# Patient Record
Sex: Male | Born: 1970
Health system: Southern US, Community
[De-identification: ages and names within clinical notes are randomized; demographics above are authoritative.]

## PROBLEM LIST (undated history)

## (undated) DIAGNOSIS — E785 Hyperlipidemia, unspecified: Secondary | ICD-10-CM

## (undated) DIAGNOSIS — I219 Acute myocardial infarction, unspecified: Secondary | ICD-10-CM

## (undated) DIAGNOSIS — K219 Gastro-esophageal reflux disease without esophagitis: Secondary | ICD-10-CM

## (undated) DIAGNOSIS — I2510 Atherosclerotic heart disease of native coronary artery without angina pectoris: Secondary | ICD-10-CM

## (undated) DIAGNOSIS — I10 Essential (primary) hypertension: Secondary | ICD-10-CM

## (undated) HISTORY — PX: CARDIAC SURGERY: SHX584

## (undated) HISTORY — DX: Gastro-esophageal reflux disease without esophagitis: K21.9

## (undated) HISTORY — DX: Essential (primary) hypertension: I10

## (undated) HISTORY — DX: Atherosclerotic heart disease of native coronary artery without angina pectoris: I25.10

## (undated) HISTORY — DX: Hyperlipidemia, unspecified: E78.5

---

## 1992-10-13 HISTORY — PX: HERNIA REPAIR: SHX51

## 2018-03-11 DIAGNOSIS — K219 Gastro-esophageal reflux disease without esophagitis: Secondary | ICD-10-CM | POA: Insufficient documentation

## 2018-03-11 DIAGNOSIS — R1013 Epigastric pain: Secondary | ICD-10-CM | POA: Insufficient documentation

## 2018-03-11 DIAGNOSIS — R0789 Other chest pain: Secondary | ICD-10-CM | POA: Insufficient documentation

## 2018-03-11 DIAGNOSIS — R1310 Dysphagia, unspecified: Secondary | ICD-10-CM | POA: Insufficient documentation

## 2018-10-21 DIAGNOSIS — M5030 Other cervical disc degeneration, unspecified cervical region: Secondary | ICD-10-CM | POA: Insufficient documentation

## 2018-10-21 DIAGNOSIS — M7552 Bursitis of left shoulder: Secondary | ICD-10-CM | POA: Insufficient documentation

## 2018-10-21 DIAGNOSIS — M19012 Primary osteoarthritis, left shoulder: Secondary | ICD-10-CM | POA: Insufficient documentation

## 2019-01-09 DIAGNOSIS — Z23 Encounter for immunization: Secondary | ICD-10-CM | POA: Diagnosis not present

## 2019-03-15 DIAGNOSIS — M7652 Patellar tendinitis, left knee: Secondary | ICD-10-CM | POA: Diagnosis not present

## 2019-03-15 DIAGNOSIS — M25562 Pain in left knee: Secondary | ICD-10-CM | POA: Diagnosis not present

## 2019-04-04 DIAGNOSIS — K219 Gastro-esophageal reflux disease without esophagitis: Secondary | ICD-10-CM | POA: Diagnosis not present

## 2019-04-04 DIAGNOSIS — K222 Esophageal obstruction: Secondary | ICD-10-CM | POA: Diagnosis not present

## 2019-04-04 DIAGNOSIS — R1310 Dysphagia, unspecified: Secondary | ICD-10-CM | POA: Diagnosis not present

## 2019-06-02 DIAGNOSIS — K228 Other specified diseases of esophagus: Secondary | ICD-10-CM | POA: Diagnosis not present

## 2019-06-02 DIAGNOSIS — K449 Diaphragmatic hernia without obstruction or gangrene: Secondary | ICD-10-CM | POA: Diagnosis not present

## 2019-06-02 DIAGNOSIS — K222 Esophageal obstruction: Secondary | ICD-10-CM | POA: Diagnosis not present

## 2019-06-27 DIAGNOSIS — H00013 Hordeolum externum right eye, unspecified eyelid: Secondary | ICD-10-CM | POA: Diagnosis not present

## 2019-07-10 DIAGNOSIS — J069 Acute upper respiratory infection, unspecified: Secondary | ICD-10-CM | POA: Diagnosis not present

## 2019-07-10 DIAGNOSIS — J029 Acute pharyngitis, unspecified: Secondary | ICD-10-CM | POA: Diagnosis not present

## 2019-07-10 DIAGNOSIS — R05 Cough: Secondary | ICD-10-CM | POA: Diagnosis not present

## 2019-07-19 DIAGNOSIS — K222 Esophageal obstruction: Secondary | ICD-10-CM | POA: Diagnosis not present

## 2019-07-19 DIAGNOSIS — K219 Gastro-esophageal reflux disease without esophagitis: Secondary | ICD-10-CM | POA: Diagnosis not present

## 2019-08-31 DIAGNOSIS — L820 Inflamed seborrheic keratosis: Secondary | ICD-10-CM | POA: Diagnosis not present

## 2019-08-31 DIAGNOSIS — D224 Melanocytic nevi of scalp and neck: Secondary | ICD-10-CM | POA: Diagnosis not present

## 2020-04-11 DIAGNOSIS — L2089 Other atopic dermatitis: Secondary | ICD-10-CM | POA: Diagnosis not present

## 2020-04-11 DIAGNOSIS — L255 Unspecified contact dermatitis due to plants, except food: Secondary | ICD-10-CM | POA: Diagnosis not present

## 2020-08-07 DIAGNOSIS — B029 Zoster without complications: Secondary | ICD-10-CM | POA: Diagnosis not present

## 2020-08-08 DIAGNOSIS — K222 Esophageal obstruction: Secondary | ICD-10-CM | POA: Diagnosis not present

## 2020-08-08 DIAGNOSIS — Z1211 Encounter for screening for malignant neoplasm of colon: Secondary | ICD-10-CM | POA: Diagnosis not present

## 2020-08-08 DIAGNOSIS — R1310 Dysphagia, unspecified: Secondary | ICD-10-CM | POA: Diagnosis not present

## 2020-08-08 DIAGNOSIS — K219 Gastro-esophageal reflux disease without esophagitis: Secondary | ICD-10-CM | POA: Diagnosis not present

## 2020-09-13 DIAGNOSIS — K219 Gastro-esophageal reflux disease without esophagitis: Secondary | ICD-10-CM | POA: Diagnosis not present

## 2020-09-13 DIAGNOSIS — R1319 Other dysphagia: Secondary | ICD-10-CM | POA: Diagnosis not present

## 2020-09-13 DIAGNOSIS — R1310 Dysphagia, unspecified: Secondary | ICD-10-CM | POA: Diagnosis not present

## 2020-09-13 DIAGNOSIS — K209 Esophagitis, unspecified without bleeding: Secondary | ICD-10-CM | POA: Diagnosis not present

## 2020-09-13 DIAGNOSIS — K2289 Other specified disease of esophagus: Secondary | ICD-10-CM | POA: Diagnosis not present

## 2020-09-13 DIAGNOSIS — K2970 Gastritis, unspecified, without bleeding: Secondary | ICD-10-CM | POA: Diagnosis not present

## 2020-09-13 DIAGNOSIS — K449 Diaphragmatic hernia without obstruction or gangrene: Secondary | ICD-10-CM | POA: Diagnosis not present

## 2020-09-13 DIAGNOSIS — K222 Esophageal obstruction: Secondary | ICD-10-CM | POA: Diagnosis not present

## 2020-10-22 ENCOUNTER — Encounter: Payer: Self-pay | Admitting: Physician Assistant

## 2020-10-22 ENCOUNTER — Ambulatory Visit: Payer: BC Managed Care – PPO | Admitting: Physician Assistant

## 2020-10-22 ENCOUNTER — Other Ambulatory Visit: Payer: Self-pay

## 2020-10-22 VITALS — BP 120/78 | HR 75 | Temp 97.3°F | Ht 66.0 in | Wt 177.0 lb

## 2020-10-22 DIAGNOSIS — M5441 Lumbago with sciatica, right side: Secondary | ICD-10-CM | POA: Diagnosis not present

## 2020-10-22 MED ORDER — PREDNISONE 20 MG PO TABS: ORAL_TABLET | ORAL | 0 refills | Status: AC

## 2020-10-22 MED ORDER — CYCLOBENZAPRINE HCL 5 MG PO TABS: 5.0000 mg | ORAL_TABLET | Freq: Every day | ORAL | 0 refills | Status: DC

## 2020-10-22 NOTE — Patient Instructions (Signed)

## 2020-10-22 NOTE — Progress Notes (Signed)
Acute Office Visit  Subjective:    Patient ID: Jordan Lewis, male    DOB: 26-Dec-1970, 50 y.o.   MRN: [id number]  Chief Complaint  Patient presents with  . Back Pain    HPI Patient is in today for low back pain Pt states that over the past few weeks he has had some low back pain with numbness and tingling down his right leg - states it started after lifting Christmas decorations Denies weakness in right leg - has been using some advil  Past Medical History:  Diagnosis Date  . GERD (gastroesophageal reflux disease)     Past Surgical History:  Procedure Laterality Date  . HERNIA REPAIR  1994    Family History  Problem Relation Age of Onset  . Heart disease Father     Social History   Socioeconomic History  . Marital status: Married    Spouse name: Not on file  . Number of children: Not on file  . Years of education: Not on file  . Highest education level: Not on file  Occupational History  . Not on file  Tobacco Use  . Smoking status: Never Smoker  . Smokeless tobacco: Never Used  Vaping Use  . Vaping Use: Never used  Substance and Sexual Activity  . Alcohol use: Never  . Drug use: Never  . Sexual activity: Not on file  Other Topics Concern  . Not on file  Social History Narrative  . Not on file   Social Determinants of Health   Financial Resource Strain: Not on file  Food Insecurity: Not on file  Transportation Needs: Not on file  Physical Activity: Not on file  Stress: Not on file  Social Connections: Not on file  Intimate Partner Violence: Not on file    Outpatient Medications Prior to Visit  Medication Sig Dispense Refill  . famotidine (PEPCID) 40 MG tablet Please take one tablet 30 minutes before am/pm meal     No facility-administered medications prior to visit.    No Known Allergies  Review of Systems CONSTITUTIONAL: Negative for chills, fatigue, fever, unintentional weight gain and unintentional weight loss.  CARDIOVASCULAR: Negative  for chest pain, dizziness, palpitations and pedal edema.  RESPIRATORY: Negative for recent cough and dyspnea.  MSK: see HPI PSYCHIATRIC: Negative for sleep disturbance and to question depression screen.  Negative for depression, negative for anhedonia.         Objective:    Physical Exam PHYSICAL EXAM:   VS: BP 120/78 (BP Location: Left Arm, Patient Position: Sitting, Cuff Size: Normal)   Pulse 75   Temp (!) 97.3 F (36.3 C) (Temporal)   Ht 5' 6" (1.676 m)   Wt 177 lb (80.3 kg)   SpO2 99%   BMI 28.57 kg/m   GEN: Well nourished, well developed, in no acute distress  Cardiac: RRR; no murmurs, rubs, or gallops,no edema -  Respiratory:  normal respiratory rate and pattern with no distress - normal breath sounds with no rales, rhonchi, wheezes or rubs MS: no deformity or atrophy - rom normal - strength normal Psych: euthymic mood, appropriate affect and demeanor  BP 120/78 (BP Location: Left Arm, Patient Position: Sitting, Cuff Size: Normal)   Pulse 75   Temp (!) 97.3 F (36.3 C) (Temporal)   Ht 5' 6" (1.676 m)   Wt 177 lb (80.3 kg)   SpO2 99%   BMI 28.57 kg/m  Wt Readings from Last 3 Encounters:  10/22/20 177 lb (80.3 kg)  Health Maintenance Due  Topic Date Due  . Hepatitis C Screening  Never done  . HIV Screening  Never done  . COLONOSCOPY (Pts 45-74yr Insurance coverage will need to be confirmed)  Never done  . COVID-19 Vaccine (2 - Booster for Janssen series) 06/28/2020    There are no preventive care reminders to display for this patient.   No results found for: TSH No results found for: WBC, HGB, HCT, MCV, PLT No results found for: NA, K, CHLORIDE, CO2, GLUCOSE, BUN, CREATININE, BILITOT, ALKPHOS, AST, ALT, PROT, ALBUMIN, CALCIUM, ANIONGAP, EGFR, GFR No results found for: CHOL No results found for: HDL No results found for: LDLCALC No results found for: TRIG No results found for: CHOLHDL No results found for: HGBA1C     Assessment & Plan:  1. Acute  right-sided low back pain with right-sided sciatica - predniSONE (DELTASONE) 20 MG tablet; Take 3 tablets (60 mg total) by mouth daily with breakfast for 3 days, THEN 2 tablets (40 mg total) daily with breakfast for 3 days, THEN 1 tablet (20 mg total) daily with breakfast for 3 days.  Dispense: 18 tablet; Refill: 0 - cyclobenzaprine (FLEXERIL) 5 MG tablet; Take 1 tablet (5 mg total) by mouth at bedtime.  Dispense: 30 tablet; Refill: 0    Meds ordered this encounter  Medications  . predniSONE (DELTASONE) 20 MG tablet    Sig: Take 3 tablets (60 mg total) by mouth daily with breakfast for 3 days, THEN 2 tablets (40 mg total) daily with breakfast for 3 days, THEN 1 tablet (20 mg total) daily with breakfast for 3 days.    Dispense:  18 tablet    Refill:  0    Order Specific Question:   Supervising Provider    Answer:Rochel Brome[[id number] . cyclobenzaprine (FLEXERIL) 5 MG tablet    Sig: Take 1 tablet (5 mg total) by mouth at bedtime.    Dispense:  30 tablet    Refill:  0    Order Specific Question:   Supervising Provider    Answer:   CShelton Silvas   No orders of the defined types were placed in this encounter.     Follow-up: Return if symptoms worsen or fail to improve.  An After Visit Summary was printed and given to the patient.  [facility] ([phone]

## 2020-11-05 DIAGNOSIS — I213 ST elevation (STEMI) myocardial infarction of unspecified site: Secondary | ICD-10-CM | POA: Diagnosis not present

## 2020-11-05 DIAGNOSIS — D696 Thrombocytopenia, unspecified: Secondary | ICD-10-CM | POA: Diagnosis not present

## 2020-11-05 DIAGNOSIS — I2119 ST elevation (STEMI) myocardial infarction involving other coronary artery of inferior wall: Secondary | ICD-10-CM | POA: Diagnosis not present

## 2020-11-05 DIAGNOSIS — I499 Cardiac arrhythmia, unspecified: Secondary | ICD-10-CM | POA: Diagnosis not present

## 2020-11-05 DIAGNOSIS — Z955 Presence of coronary angioplasty implant and graft: Secondary | ICD-10-CM | POA: Insufficient documentation

## 2020-11-05 DIAGNOSIS — I2111 ST elevation (STEMI) myocardial infarction involving right coronary artery: Secondary | ICD-10-CM | POA: Diagnosis not present

## 2020-11-05 DIAGNOSIS — R0789 Other chest pain: Secondary | ICD-10-CM | POA: Diagnosis not present

## 2020-11-05 DIAGNOSIS — I2510 Atherosclerotic heart disease of native coronary artery without angina pectoris: Secondary | ICD-10-CM | POA: Diagnosis not present

## 2020-11-05 DIAGNOSIS — I25118 Atherosclerotic heart disease of native coronary artery with other forms of angina pectoris: Secondary | ICD-10-CM | POA: Diagnosis not present

## 2020-11-05 DIAGNOSIS — R9431 Abnormal electrocardiogram [ECG] [EKG]: Secondary | ICD-10-CM | POA: Diagnosis not present

## 2020-11-05 DIAGNOSIS — E785 Hyperlipidemia, unspecified: Secondary | ICD-10-CM | POA: Diagnosis not present

## 2020-11-05 DIAGNOSIS — E7849 Other hyperlipidemia: Secondary | ICD-10-CM | POA: Diagnosis not present

## 2020-11-05 DIAGNOSIS — E7800 Pure hypercholesterolemia, unspecified: Secondary | ICD-10-CM | POA: Diagnosis not present

## 2020-11-05 DIAGNOSIS — R079 Chest pain, unspecified: Secondary | ICD-10-CM | POA: Diagnosis not present

## 2020-11-07 DIAGNOSIS — E7800 Pure hypercholesterolemia, unspecified: Secondary | ICD-10-CM | POA: Insufficient documentation

## 2020-11-07 DIAGNOSIS — I25118 Atherosclerotic heart disease of native coronary artery with other forms of angina pectoris: Secondary | ICD-10-CM | POA: Insufficient documentation

## 2020-11-07 DIAGNOSIS — D696 Thrombocytopenia, unspecified: Secondary | ICD-10-CM | POA: Insufficient documentation

## 2020-11-08 ENCOUNTER — Telehealth: Payer: Self-pay | Admitting: *Deleted

## 2020-11-08 NOTE — Telephone Encounter (Signed)
Transition Care Management Follow-up Telephone Call  Date of discharge and from where: 11/07/2020  How have you been since you were released from the hospital? "I am doing well."  Any questions or concerns? No  Items Reviewed:  Did the pt receive and understand the discharge instructions provided? Yes   Medications obtained and verified? Yes   Other? Yes   Any new allergies since your discharge? No   Dietary orders reviewed? Yes  Do you have support at home? Yes   Home Care and Equipment/Supplies: Were home health services ordered? not applicable If so, what is the name of the agency? N/A  Has the agency set up a time to come to the patient's home? not applicable Were any new equipment or medical supplies ordered?  No What is the name of the medical supply agency? N/A Were you able to get the supplies/equipment? not applicable Do you have any questions related to the use of the equipment or supplies? No  Functional Questionnaire: (I = Independent and D = Dependent) ADLs: I  Bathing/Dressing- I  Meal Prep- I  Eating- I  Maintaining continence- I  Transferring/Ambulation- I  Managing Meds- I  Follow up appointments reviewed:   PCP Hospital f/u appt confirmed? No  - Patient will call to make appointment if necessary.   Specialist Hospital f/u appt confirmed? Yes  Scheduled to see Dr. Kingsley Callander (Cardio in [place]) on 11/26/2020.  Are transportation arrangements needed? No   If their condition worsens, is the pt aware to call PCP or go to the Emergency Dept.? Yes  Was the patient provided with contact information for the PCP's office or ED? Yes  Was to pt encouraged to call back with questions or concerns? Yes

## 2020-11-26 DIAGNOSIS — Z955 Presence of coronary angioplasty implant and graft: Secondary | ICD-10-CM | POA: Diagnosis not present

## 2020-11-26 DIAGNOSIS — I25118 Atherosclerotic heart disease of native coronary artery with other forms of angina pectoris: Secondary | ICD-10-CM | POA: Diagnosis not present

## 2020-11-26 DIAGNOSIS — I2111 ST elevation (STEMI) myocardial infarction involving right coronary artery: Secondary | ICD-10-CM | POA: Diagnosis not present

## 2020-11-26 DIAGNOSIS — E7800 Pure hypercholesterolemia, unspecified: Secondary | ICD-10-CM | POA: Diagnosis not present

## 2020-11-27 DIAGNOSIS — R9431 Abnormal electrocardiogram [ECG] [EKG]: Secondary | ICD-10-CM | POA: Diagnosis not present

## 2020-12-17 DIAGNOSIS — I6523 Occlusion and stenosis of bilateral carotid arteries: Secondary | ICD-10-CM | POA: Diagnosis not present

## 2021-01-22 DIAGNOSIS — M545 Low back pain, unspecified: Secondary | ICD-10-CM | POA: Diagnosis not present

## 2021-01-22 DIAGNOSIS — M5116 Intervertebral disc disorders with radiculopathy, lumbar region: Secondary | ICD-10-CM | POA: Diagnosis not present

## 2021-01-22 DIAGNOSIS — M5134 Other intervertebral disc degeneration, thoracic region: Secondary | ICD-10-CM | POA: Diagnosis not present

## 2021-03-25 DIAGNOSIS — R5383 Other fatigue: Secondary | ICD-10-CM | POA: Diagnosis not present

## 2021-03-25 DIAGNOSIS — E7800 Pure hypercholesterolemia, unspecified: Secondary | ICD-10-CM | POA: Diagnosis not present

## 2021-03-25 DIAGNOSIS — Z955 Presence of coronary angioplasty implant and graft: Secondary | ICD-10-CM | POA: Diagnosis not present

## 2021-03-25 DIAGNOSIS — I2111 ST elevation (STEMI) myocardial infarction involving right coronary artery: Secondary | ICD-10-CM | POA: Diagnosis not present

## 2021-03-25 DIAGNOSIS — I25118 Atherosclerotic heart disease of native coronary artery with other forms of angina pectoris: Secondary | ICD-10-CM | POA: Diagnosis not present

## 2021-04-12 DIAGNOSIS — R1084 Generalized abdominal pain: Secondary | ICD-10-CM | POA: Diagnosis not present

## 2021-04-12 DIAGNOSIS — R109 Unspecified abdominal pain: Secondary | ICD-10-CM | POA: Diagnosis not present

## 2021-04-22 ENCOUNTER — Emergency Department (HOSPITAL_BASED_OUTPATIENT_CLINIC_OR_DEPARTMENT_OTHER): Payer: BC Managed Care – PPO

## 2021-04-22 ENCOUNTER — Emergency Department (HOSPITAL_BASED_OUTPATIENT_CLINIC_OR_DEPARTMENT_OTHER)
Admission: EM | Admit: 2021-04-22 | Discharge: 2021-04-22 | Disposition: A | Discharge status: Home / Self Care | Payer: BC Managed Care – PPO | Attending: Emergency Medicine | Admitting: Emergency Medicine

## 2021-04-22 ENCOUNTER — Other Ambulatory Visit: Payer: Self-pay

## 2021-04-22 ENCOUNTER — Encounter (HOSPITAL_BASED_OUTPATIENT_CLINIC_OR_DEPARTMENT_OTHER): Payer: Self-pay | Admitting: Emergency Medicine

## 2021-04-22 DIAGNOSIS — R109 Unspecified abdominal pain: Secondary | ICD-10-CM | POA: Diagnosis not present

## 2021-04-22 DIAGNOSIS — K219 Gastro-esophageal reflux disease without esophagitis: Secondary | ICD-10-CM | POA: Diagnosis not present

## 2021-04-22 DIAGNOSIS — R1084 Generalized abdominal pain: Secondary | ICD-10-CM | POA: Diagnosis not present

## 2021-04-22 DIAGNOSIS — K8020 Calculus of gallbladder without cholecystitis without obstruction: Secondary | ICD-10-CM

## 2021-04-22 DIAGNOSIS — R1032 Left lower quadrant pain: Secondary | ICD-10-CM

## 2021-04-22 HISTORY — DX: Acute myocardial infarction, unspecified: I21.9

## 2021-04-22 LAB — URINALYSIS, ROUTINE W REFLEX MICROSCOPIC
Bilirubin Urine: NEGATIVE
Glucose, UA: NEGATIVE mg/dL
Hgb urine dipstick: NEGATIVE
Ketones, ur: NEGATIVE mg/dL
Leukocytes,Ua: NEGATIVE
Nitrite: NEGATIVE
Protein, ur: NEGATIVE mg/dL
Specific Gravity, Urine: <1.005 — ABNORMAL LOW (ref 1.005–1.030)
pH: 6.5 (ref 5.0–8.0)

## 2021-04-22 LAB — CBC WITH DIFFERENTIAL/PLATELET
Abs Immature Granulocytes: 0.02 10*3/uL (ref 0.00–0.07)
Basophils Absolute: 0 10*3/uL (ref 0.0–0.1)
Basophils Relative: 1 %
Eosinophils Absolute: 0 10*3/uL (ref 0.0–0.5)
Eosinophils Relative: 1 %
HCT: 48.6 % (ref 39.0–52.0)
Hemoglobin: 16.7 g/dL (ref 13.0–17.0)
Immature Granulocytes: 0 %
Lymphocytes Relative: 28 %
Lymphs Abs: 1.8 10*3/uL (ref 0.7–4.0)
MCH: 30.4 pg (ref 26.0–34.0)
MCHC: 34.4 g/dL (ref 30.0–36.0)
MCV: 88.4 fL (ref 80.0–100.0)
Monocytes Absolute: 0.6 10*3/uL (ref 0.1–1.0)
Monocytes Relative: 9 %
Neutro Abs: 3.9 10*3/uL (ref 1.7–7.7)
Neutrophils Relative %: 61 %
Platelets: 164 10*3/uL (ref 150–400)
RBC: 5.5 MIL/uL (ref 4.22–5.81)
RDW: 12.1 % (ref 11.5–15.5)
WBC: 6.4 10*3/uL (ref 4.0–10.5)
nRBC: 0 % (ref 0.0–0.2)

## 2021-04-22 LAB — COMPREHENSIVE METABOLIC PANEL
ALT: 71 U/L — ABNORMAL HIGH (ref 0–44)
AST: 52 U/L — ABNORMAL HIGH (ref 15–41)
Albumin: 4.5 g/dL (ref 3.5–5.0)
Alkaline Phosphatase: 69 U/L (ref 38–126)
Anion gap: 6 (ref 5–15)
BUN: 16 mg/dL (ref 6–20)
CO2: 29 mmol/L (ref 22–32)
Calcium: 9.2 mg/dL (ref 8.9–10.3)
Chloride: 103 mmol/L (ref 98–111)
Creatinine, Ser: 1.07 mg/dL (ref 0.61–1.24)
GFR, Estimated: >60 mL/min (ref >60)
Glucose, Bld: 110 mg/dL — ABNORMAL HIGH (ref 70–99)
Potassium: 4.2 mmol/L (ref 3.5–5.1)
Sodium: 138 mmol/L (ref 135–145)
Total Bilirubin: 0.3 mg/dL (ref 0.3–1.2)
Total Protein: 7.1 g/dL (ref 6.5–8.1)

## 2021-04-22 MED ORDER — IOHEXOL 300 MG/ML  SOLN
100.0000 mL | Freq: Once | INTRAMUSCULAR | Status: AC | PRN
  Administered 2021-04-22: 100 mL via INTRAVENOUS

## 2021-04-22 NOTE — ED Provider Notes (Signed)
[facility] EMERGENCY DEPARTMENT Provider Note   CSN: [id number] Arrival date & time: 04/22/21  1706     History Chief Complaint  Patient presents with   Flank Pain    Jordan Lewis is a 50 y.o. male.  The history is provided by the patient. No language interpreter was used.  Abdominal Pain Pain location:  Generalized Pain radiates to:  Does not radiate Pain severity:  Moderate Onset quality:  Gradual Timing:  Constant Progression:  Worsening Relieved by:  Nothing Worsened by:  Nothing Ineffective treatments:  None tried Associated symptoms: no nausea   Risk factors: no alcohol abuse and has not had multiple surgeries   Pt complains of left lower abdominal pain. Pt is finishing antibiotics for diverticulitis.  Pt reports he still has left lower abdominal discomfort.  Pt's MD advised him to come to the ED for ct scan.     Past Medical History:  Diagnosis Date   GERD (gastroesophageal reflux disease)    MI (myocardial infarction) (HCC)     Patient Active Problem List   Diagnosis Date Noted   Esophageal stricture 04/04/2019   Patellar tendinitis of left knee 03/15/2019   Arthritis of left acromioclavicular joint 10/21/2018   Degenerative cervical disc 10/21/2018   Subacromial bursitis of left shoulder joint 10/21/2018   Atypical chest pain 03/11/2018   Dysphagia 03/11/2018   Epigastric pain 03/11/2018   Gastroesophageal reflux disease 03/11/2018    Past Surgical History:  Procedure Laterality Date   CARDIAC SURGERY     HERNIA REPAIR  1994       Family History  Problem Relation Age of Onset   Heart disease Father     Social History   Tobacco Use   Smoking status: Never   Smokeless tobacco: Never  Vaping Use   Vaping Use: Never used  Substance Use Topics   Alcohol use: Never   Drug use: Never    Home Medications Prior to Admission medications   Medication Sig Start Date End Date Taking? Authorizing Provider  cyclobenzaprine (FLEXERIL)  5 MG tablet Take 1 tablet (5 mg total) by mouth at bedtime. 10/22/20   Marianne Easter [place], PA-C  famotidine (PEPCID) 40 MG tablet Please take one tablet 30 minutes before am/pm meal 10/03/20   [provider]    Allergies    Patient has no known allergies.  Review of Systems   Review of Systems  Gastrointestinal:  Positive for abdominal pain. Negative for nausea.  All other systems reviewed and are negative.  Physical Exam Updated Vital Signs BP (!) 143/89 (BP Location: Left Arm)   Pulse 68   Temp 98 F (36.7 C) (Oral)   Resp 20   Ht 5\' 6"  (1.676 m)   Wt 76.2 kg   SpO2 99%   BMI 27.12 kg/m   Physical Exam Vitals and nursing note reviewed.  Constitutional:      Appearance: He is well-developed.  HENT:     Head: Normocephalic.     Right Ear: Tympanic membrane normal.     Left Ear: Tympanic membrane normal.     Nose: Nose normal.     Mouth/Throat:     Mouth: Mucous membranes are moist.  Cardiovascular:     Rate and Rhythm: Normal rate.  Pulmonary:     Effort: Pulmonary effort is normal.  Abdominal:     General: There is no distension.  Musculoskeletal:        General: Normal range of motion.  Cervical back: Normal range of motion.  Skin:    General: Skin is warm.  Neurological:     Mental Status: He is alert and oriented to person, place, and time.  Psychiatric:        Mood and Affect: Mood normal.    ED Results / Procedures / Treatments   Labs (all labs ordered are listed, but only abnormal results are displayed) Labs Reviewed - No data to display  EKG None  Radiology CT ABDOMEN PELVIS W CONTRAST  Result Date: 04/22/2021 CLINICAL DATA:  Abdominal pain EXAM: CT ABDOMEN AND PELVIS WITH CONTRAST TECHNIQUE: Multidetector CT imaging of the abdomen and pelvis was performed using the standard protocol following bolus administration of intravenous contrast. CONTRAST:  OMNIPAQUE IOHEXOL 300 MG/ML  SOLN COMPARISON:  None. FINDINGS: LOWER CHEST: Normal.  HEPATOBILIARY: Normal hepatic contours. No intra- or extrahepatic biliary dilatation. There is cholelithiasis without acute inflammation. PANCREAS: Normal pancreas. No ductal dilatation or peripancreatic fluid collection. SPLEEN: Normal. ADRENALS/URINARY TRACT: The adrenal glands are normal. No hydronephrosis, nephroureterolithiasis or solid renal mass. The urinary bladder is normal for degree of distention STOMACH/BOWEL: There is no hiatal hernia. Normal duodenal course and caliber. No small bowel dilatation or inflammation. Rectosigmoid diverticulosis without acute inflammation. Normal appendix. VASCULAR/LYMPHATIC: Normal course and caliber of the major abdominal vessels. No abdominal or pelvic lymphadenopathy. REPRODUCTIVE: Normal prostate size with symmetric seminal vesicles. MUSCULOSKELETAL. No bony spinal canal stenosis or focal osseous abnormality. OTHER: None. IMPRESSION: 1. No acute abnormality of the abdomen or pelvis. 2. Cholelithiasis without acute inflammation. Electronically Signed   By: Deatra Robinson M.D.   On: 04/22/2021 20:31    Procedures Procedures   Medications Ordered in ED Medications - No data to display  ED Course  I have reviewed the triage vital signs and the nursing notes.  Pertinent labs & imaging results that were available during my care of the patient were reviewed by me and considered in my medical decision making (see chart for details).    MDM Rules/Calculators/A&P                          MDM: Pt counseled on results, no diverticulitis.  Pt advised of gallstone and follow up   Final Clinical Impression(s) / ED Diagnoses Final diagnoses:  Abdominal pain, left lower quadrant  Calculus of gallbladder without cholecystitis without obstruction    Rx / DC Orders ED Discharge Orders     None     An After Visit Summary was printed and given to the patient.    Osie Cheeks 04/23/21 1538    Palumbo, April, MD 05/02/21 [phone]

## 2021-04-22 NOTE — Discharge Instructions (Addendum)
Follow up with Gi for recheck.  Return if any problems.

## 2021-04-22 NOTE — ED Triage Notes (Signed)
Pt arrives pov with c/o L flank pain x 4days. Pt endorses diverticulitis, treated with abx, pain improved in lower abdomen. Pt denies dysuria, or constipation, deneis fever. Pt denies N/V

## 2021-04-22 NOTE — ED Notes (Signed)
Patient reports mild llq pain radiating to left flank, patient has been on antibiotics for diverticulitis and just completed his full course, "expected the pain to be gone by now, it's better, but not completely gone.

## 2021-05-03 ENCOUNTER — Encounter: Payer: Self-pay | Admitting: Physician Assistant

## 2021-05-03 ENCOUNTER — Ambulatory Visit: Payer: BC Managed Care – PPO | Admitting: Physician Assistant

## 2021-05-03 ENCOUNTER — Other Ambulatory Visit: Payer: Self-pay

## 2021-05-03 VITALS — BP 118/82 | HR 67 | Temp 97.3°F | Ht 66.0 in | Wt 172.8 lb

## 2021-05-03 DIAGNOSIS — I25118 Atherosclerotic heart disease of native coronary artery with other forms of angina pectoris: Secondary | ICD-10-CM | POA: Diagnosis not present

## 2021-05-03 NOTE — Progress Notes (Signed)
Subjective:  Patient ID: Jordan Lewis, male    DOB: 06-15-1971  Age: 50 y.o. MRN: [id number]  Chief Complaint  Patient presents with   Follow-up    HPI  Pt with history of CAD - he had MI in January - follows with Dr Katrinka Blazing - currently on metoprolol, lipitor and Prasugrel - voices no chest pain/sob/edema  Pt recently had diverticulitis and was treated with antibiotics - he had CT of abdomen after his treatment on 7/11 because he was still having some discomfort - and that test was normal Current Outpatient Medications on File Prior to Visit  Medication Sig Dispense Refill   aspirin 81 MG EC tablet Take 1 tablet by mouth daily.     atorvastatin (LIPITOR) 80 MG tablet Take by mouth.     lisinopril (ZESTRIL) 5 MG tablet Take 1 tablet by mouth daily.     metoprolol tartrate (LOPRESSOR) 25 MG tablet Take by mouth.     cyclobenzaprine (FLEXERIL) 5 MG tablet Take 1 tablet (5 mg total) by mouth at bedtime. 30 tablet 0   famotidine (PEPCID) 40 MG tablet Please take one tablet 30 minutes before am/pm meal     prasugrel (EFFIENT) 10 MG TABS tablet Take 10 mg by mouth daily.     No current facility-administered medications on file prior to visit.   Past Medical History:  Diagnosis Date   GERD (gastroesophageal reflux disease)    MI (myocardial infarction) (HCC)    Past Surgical History:  Procedure Laterality Date   CARDIAC SURGERY     HERNIA REPAIR  1994    Family History  Problem Relation Age of Onset   Heart disease Father    Social History   Socioeconomic History   Marital status: Married    Spouse name: Not on file   Number of children: Not on file   Years of education: Not on file   Highest education level: Not on file  Occupational History   Not on file  Tobacco Use   Smoking status: Never   Smokeless tobacco: Never  Vaping Use   Vaping Use: Never used  Substance and Sexual Activity   Alcohol use: Never   Drug use: Never   Sexual activity: Not on file  Other Topics  Concern   Not on file  Social History Narrative   Not on file   Social Determinants of Health   Financial Resource Strain: Not on file  Food Insecurity: Not on file  Transportation Needs: Not on file  Physical Activity: Not on file  Stress: Not on file  Social Connections: Not on file    Review of Systems  CONSTITUTIONAL: Negative for chills, fatigue, fever, unintentional weight gain and unintentional weight loss.  E/N/T: Negative for ear pain, nasal congestion and sore throat.  CARDIOVASCULAR: Negative for chest pain, dizziness, palpitations and pedal edema.  RESPIRATORY: Negative for recent cough and dyspnea.  GASTROINTESTINAL: Negative for abdominal pain, acid reflux symptoms, constipation, diarrhea, nausea and vomiting.  MSK: Negative for arthralgias and myalgias.  INTEGUMENTARY: Negative for rash.  NEUROLOGICAL: Negative for dizziness and headaches.  PSYCHIATRIC: Negative for sleep disturbance and to question depression screen.  Negative for depression, negative for anhedonia.      Objective:  BP 118/82 (BP Location: Left Arm, Patient Position: Sitting, Cuff Size: Normal)   Pulse 67   Temp (!) 97.3 F (36.3 C) (Temporal)   Ht 5\' 6"  (1.676 m)   Wt 172 lb 12.8 oz (78.4 kg)   SpO2  95%   BMI 27.89 kg/m   BP/Weight 05/03/2021 04/22/2021 10/22/2020  Systolic BP 118 129 120  Diastolic BP 82 92 78  Wt. (Lbs) 172.8 168 177  BMI 27.89 27.12 28.57    Physical Exam PHYSICAL EXAM:   VS: BP 118/82 (BP Location: Left Arm, Patient Position: Sitting, Cuff Size: Normal)   Pulse 67   Temp (!) 97.3 F (36.3 C) (Temporal)   Ht 5\' 6"  (1.676 m)   Wt 172 lb 12.8 oz (78.4 kg)   SpO2 95%   BMI 27.89 kg/m   GEN: Well nourished, well developed, in no acute distress  Cardiac: RRR; no murmurs, rubs, or gallops,no edema - Respiratory:  normal respiratory rate and pattern with no distress - normal breath sounds with no rales, rhonchi, wheezes or rubs GI: normal bowel sounds, no masses  or tenderness Psych: euthymic mood, appropriate affect and demeanor  Diabetic Foot Exam - Simple   No data filed      Lab Results  Component Value Date   WBC 6.4 04/22/2021   HGB 16.7 04/22/2021   HCT 48.6 04/22/2021   PLT 164 04/22/2021   GLUCOSE 110 (H) 04/22/2021   ALT 71 (H) 04/22/2021   AST 52 (H) 04/22/2021   NA 138 04/22/2021   K 4.2 04/22/2021   CL 103 04/22/2021   CREATININE 1.07 04/22/2021   BUN 16 04/22/2021   CO2 29 04/22/2021      Assessment & Plan:   1. Coronary artery disease of native artery of native heart with stable angina pectoris (HCC) - CBC with Differential/Platelet - Comprehensive metabolic panel - Lipid panel    No orders of the defined types were placed in this encounter.   Orders Placed This Encounter  Procedures   CBC with Differential/Platelet   Comprehensive metabolic panel   Lipid panel     Follow-up: Return in about 6 months (around 11/03/2021) for chronic fasting follow up.  An After Visit Summary was printed and given to the patient.  11/05/2021 [facility] [phone]

## 2021-05-04 LAB — COMPREHENSIVE METABOLIC PANEL
ALT: 54 IU/L — ABNORMAL HIGH (ref 0–44)
AST: 29 IU/L (ref 0–40)
Albumin/Globulin Ratio: 2.9 — ABNORMAL HIGH (ref 1.2–2.2)
Albumin: 4.7 g/dL (ref 4.0–5.0)
Alkaline Phosphatase: 87 IU/L (ref 44–121)
BUN/Creatinine Ratio: 14 (ref 9–20)
BUN: 16 mg/dL (ref 6–24)
Bilirubin Total: 0.6 mg/dL (ref 0.0–1.2)
CO2: 22 mmol/L (ref 20–29)
Calcium: 9.7 mg/dL (ref 8.7–10.2)
Chloride: 102 mmol/L (ref 96–106)
Creatinine, Ser: 1.14 mg/dL (ref 0.76–1.27)
Globulin, Total: 1.6 g/dL (ref 1.5–4.5)
Glucose: 107 mg/dL — ABNORMAL HIGH (ref 65–99)
Potassium: 5.8 mmol/L — ABNORMAL HIGH (ref 3.5–5.2)
Sodium: 144 mmol/L (ref 134–144)
Total Protein: 6.3 g/dL (ref 6.0–8.5)
eGFR: 78 mL/min/{1.73_m2} (ref >59)

## 2021-05-04 LAB — CBC WITH DIFFERENTIAL/PLATELET
Basophils Absolute: 0 10*3/uL (ref 0.0–0.2)
Basos: 1 %
EOS (ABSOLUTE): 0.1 10*3/uL (ref 0.0–0.4)
Eos: 1 %
Hematocrit: 50.3 % (ref 37.5–51.0)
Hemoglobin: 16.8 g/dL (ref 13.0–17.7)
Immature Grans (Abs): 0 10*3/uL (ref 0.0–0.1)
Immature Granulocytes: 0 %
Lymphocytes Absolute: 1.7 10*3/uL (ref 0.7–3.1)
Lymphs: 29 %
MCH: 29.8 pg (ref 26.6–33.0)
MCHC: 33.4 g/dL (ref 31.5–35.7)
MCV: 89 fL (ref 79–97)
Monocytes Absolute: 0.6 10*3/uL (ref 0.1–0.9)
Monocytes: 10 %
Neutrophils Absolute: 3.5 10*3/uL (ref 1.4–7.0)
Neutrophils: 59 %
Platelets: 134 10*3/uL — ABNORMAL LOW (ref 150–450)
RBC: 5.64 x10E6/uL (ref 4.14–5.80)
RDW: 12.6 % (ref 11.6–15.4)
WBC: 5.9 10*3/uL (ref 3.4–10.8)

## 2021-05-04 LAB — LIPID PANEL
Chol/HDL Ratio: 3.1 ratio (ref 0.0–5.0)
Cholesterol, Total: 110 mg/dL (ref 100–199)
HDL: 35 mg/dL — ABNORMAL LOW (ref >39)
LDL Chol Calc (NIH): 55 mg/dL (ref 0–99)
Triglycerides: 109 mg/dL (ref 0–149)
VLDL Cholesterol Cal: 20 mg/dL (ref 5–40)

## 2021-05-04 LAB — CARDIOVASCULAR RISK ASSESSMENT

## 2021-05-06 ENCOUNTER — Other Ambulatory Visit: Payer: Self-pay | Admitting: Physician Assistant

## 2021-05-06 DIAGNOSIS — R899 Unspecified abnormal finding in specimens from other organs, systems and tissues: Secondary | ICD-10-CM

## 2021-05-22 ENCOUNTER — Other Ambulatory Visit: Payer: Self-pay | Admitting: Physician Assistant

## 2021-05-22 ENCOUNTER — Other Ambulatory Visit: Payer: BC Managed Care – PPO

## 2021-05-22 ENCOUNTER — Other Ambulatory Visit: Payer: Self-pay

## 2021-05-22 DIAGNOSIS — R899 Unspecified abnormal finding in specimens from other organs, systems and tissues: Secondary | ICD-10-CM

## 2021-05-23 ENCOUNTER — Other Ambulatory Visit: Payer: Self-pay | Admitting: Physician Assistant

## 2021-05-23 DIAGNOSIS — R899 Unspecified abnormal finding in specimens from other organs, systems and tissues: Secondary | ICD-10-CM

## 2021-05-23 LAB — COMPREHENSIVE METABOLIC PANEL
ALT: 55 IU/L — ABNORMAL HIGH (ref 0–44)
AST: 35 IU/L (ref 0–40)
Albumin/Globulin Ratio: 2.6 — ABNORMAL HIGH (ref 1.2–2.2)
Albumin: 4.7 g/dL (ref 4.0–5.0)
Alkaline Phosphatase: 100 IU/L (ref 44–121)
BUN/Creatinine Ratio: 10 (ref 9–20)
BUN: 12 mg/dL (ref 6–24)
Bilirubin Total: 0.7 mg/dL (ref 0.0–1.2)
CO2: 24 mmol/L (ref 20–29)
Calcium: 9.4 mg/dL (ref 8.7–10.2)
Chloride: 104 mmol/L (ref 96–106)
Creatinine, Ser: 1.16 mg/dL (ref 0.76–1.27)
Globulin, Total: 1.8 g/dL (ref 1.5–4.5)
Glucose: 96 mg/dL (ref 65–99)
Potassium: 5.1 mmol/L (ref 3.5–5.2)
Sodium: 143 mmol/L (ref 134–144)
Total Protein: 6.5 g/dL (ref 6.0–8.5)
eGFR: 77 mL/min/{1.73_m2} (ref >59)

## 2021-05-23 LAB — CBC WITH DIFFERENTIAL/PLATELET
Basophils Absolute: 0 10*3/uL (ref 0.0–0.2)
Basos: 1 %
EOS (ABSOLUTE): 0.1 10*3/uL (ref 0.0–0.4)
Eos: 1 %
Hematocrit: 49.6 % (ref 37.5–51.0)
Hemoglobin: 16.5 g/dL (ref 13.0–17.7)
Immature Grans (Abs): 0 10*3/uL (ref 0.0–0.1)
Immature Granulocytes: 0 %
Lymphocytes Absolute: 1.7 10*3/uL (ref 0.7–3.1)
Lymphs: 31 %
MCH: 30.1 pg (ref 26.6–33.0)
MCHC: 33.3 g/dL (ref 31.5–35.7)
MCV: 90 fL (ref 79–97)
Monocytes Absolute: 0.6 10*3/uL (ref 0.1–0.9)
Monocytes: 10 %
Neutrophils Absolute: 3.2 10*3/uL (ref 1.4–7.0)
Neutrophils: 57 %
Platelets: 128 10*3/uL — ABNORMAL LOW (ref 150–450)
RBC: 5.49 x10E6/uL (ref 4.14–5.80)
RDW: 12.7 % (ref 11.6–15.4)
WBC: 5.6 10*3/uL (ref 3.4–10.8)

## 2021-07-04 ENCOUNTER — Other Ambulatory Visit: Payer: Self-pay | Admitting: Physician Assistant

## 2021-07-04 ENCOUNTER — Telehealth: Payer: Self-pay

## 2021-07-04 MED ORDER — FAMOTIDINE 40 MG PO TABS: ORAL_TABLET | ORAL | 2 refills | Status: DC

## 2021-07-04 NOTE — Telephone Encounter (Signed)
Patient wanted to know if you can send him in Famotidine 40 mg twice a day for his GERD

## 2021-09-16 DIAGNOSIS — Z955 Presence of coronary angioplasty implant and graft: Secondary | ICD-10-CM | POA: Diagnosis not present

## 2021-09-16 DIAGNOSIS — E7800 Pure hypercholesterolemia, unspecified: Secondary | ICD-10-CM | POA: Diagnosis not present

## 2021-09-16 DIAGNOSIS — I25118 Atherosclerotic heart disease of native coronary artery with other forms of angina pectoris: Secondary | ICD-10-CM | POA: Diagnosis not present

## 2021-09-16 DIAGNOSIS — I2111 ST elevation (STEMI) myocardial infarction involving right coronary artery: Secondary | ICD-10-CM | POA: Diagnosis not present

## 2021-09-26 DIAGNOSIS — Z955 Presence of coronary angioplasty implant and graft: Secondary | ICD-10-CM | POA: Diagnosis not present

## 2021-09-26 DIAGNOSIS — I2111 ST elevation (STEMI) myocardial infarction involving right coronary artery: Secondary | ICD-10-CM | POA: Diagnosis not present

## 2021-09-26 DIAGNOSIS — I25118 Atherosclerotic heart disease of native coronary artery with other forms of angina pectoris: Secondary | ICD-10-CM | POA: Diagnosis not present

## 2021-09-28 ENCOUNTER — Other Ambulatory Visit: Payer: Self-pay | Admitting: Physician Assistant

## 2021-10-20 DIAGNOSIS — R300 Dysuria: Secondary | ICD-10-CM | POA: Diagnosis not present

## 2021-11-12 ENCOUNTER — Other Ambulatory Visit: Payer: Self-pay

## 2021-11-12 ENCOUNTER — Ambulatory Visit: Payer: BC Managed Care – PPO | Admitting: Physician Assistant

## 2021-11-12 ENCOUNTER — Encounter: Payer: Self-pay | Admitting: Physician Assistant

## 2021-11-12 VITALS — BP 112/70 | HR 60 | Temp 97.8°F | Ht 66.0 in | Wt 166.8 lb

## 2021-11-12 DIAGNOSIS — Z23 Encounter for immunization: Secondary | ICD-10-CM | POA: Diagnosis not present

## 2021-11-12 DIAGNOSIS — K219 Gastro-esophageal reflux disease without esophagitis: Secondary | ICD-10-CM | POA: Diagnosis not present

## 2021-11-12 DIAGNOSIS — Z125 Encounter for screening for malignant neoplasm of prostate: Secondary | ICD-10-CM | POA: Diagnosis not present

## 2021-11-12 DIAGNOSIS — E7800 Pure hypercholesterolemia, unspecified: Secondary | ICD-10-CM | POA: Diagnosis not present

## 2021-11-12 DIAGNOSIS — I25118 Atherosclerotic heart disease of native coronary artery with other forms of angina pectoris: Secondary | ICD-10-CM

## 2021-11-12 NOTE — Progress Notes (Signed)
Subjective:  Patient ID: Jordan Lewis, male    DOB: 1971-07-17  Age: 51 y.o. MRN: [id number]  Chief Complaint  Patient presents with   Coronary Artery Disease    HPI  Pt in for follow up of CAD - pt had MI 10/2020 - is currently following with cardiology Dr Katrinka Blazing - He is taking lipitor 80mg  qd, lopressor 25mg  bid, ASA 81mg  qd and Effient 10mg  qd (which he will complete in a few weeks per pt) He voices no concerns or problems today - no chest pain, dyspnea, fatigue  Pt with history of GERD - symptoms stable with pepcid 40mg  qd  Pt would like shingrix vaccine today  Current Outpatient Medications on File Prior to Visit  Medication Sig Dispense Refill   aspirin EC 81 MG tablet Take 81 mg by mouth daily. Swallow whole.     atorvastatin (LIPITOR) 80 MG tablet Take by mouth.     famotidine (PEPCID) 40 MG tablet TAKE 1 TABLET BY MOUTH 30 MINUTES BEFORE MORNING AND EVENING MEAL 60 tablet 2   metoprolol tartrate (LOPRESSOR) 25 MG tablet Take 25 mg by mouth in the morning and at bedtime.     prasugrel (EFFIENT) 10 MG TABS tablet Take 10 mg by mouth daily.     No current facility-administered medications on file prior to visit.   Past Medical History:  Diagnosis Date   GERD (gastroesophageal reflux disease)    MI (myocardial infarction) (HCC)    Past Surgical History:  Procedure Laterality Date   CARDIAC SURGERY     HERNIA REPAIR  1994    Family History  Problem Relation Age of Onset   Heart disease Father    Social History   Socioeconomic History   Marital status: Married    Spouse name: Not on file   Number of children: Not on file   Years of education: Not on file   Highest education level: Not on file  Occupational History   Not on file  Tobacco Use   Smoking status: Never   Smokeless tobacco: Never  Vaping Use   Vaping Use: Never used  Substance and Sexual Activity   Alcohol use: Never   Drug use: Never   Sexual activity: Not on file  Other Topics Concern   Not  on file  Social History Narrative   Not on file   Social Determinants of Health   Financial Resource Strain: Not on file  Food Insecurity: Not on file  Transportation Needs: Not on file  Physical Activity: Not on file  Stress: Not on file  Social Connections: Not on file    Review of Systems CONSTITUTIONAL: Negative for chills, fatigue, fever, unintentional weight gain and unintentional weight loss.  E/N/T: Negative for ear pain, nasal congestion and sore throat.  CARDIOVASCULAR: Negative for chest pain, dizziness, palpitations and pedal edema.  RESPIRATORY: Negative for recent cough and dyspnea.  GASTROINTESTINAL: Negative for abdominal pain, acid reflux symptoms, constipation, diarrhea, nausea and vomiting.  MSK: Negative for arthralgias and myalgias.  INTEGUMENTARY: Negative for rash.        Objective:  BP 112/70    Pulse 60    Temp 97.8 F (36.6 C)    Ht 5\' 6"  (1.676 m)    Wt 166 lb 12.8 oz (75.7 kg)    SpO2 99%    BMI 26.92 kg/m   BP/Weight 11/12/2021 05/03/2021 04/22/2021  Systolic BP 112 118 129  Diastolic BP 70 82 92  Wt. (Lbs) 166.8 172.8 168  BMI 26.92 27.89 27.12    Physical Exam PHYSICAL EXAM:   VS: BP 112/70    Pulse 60    Temp 97.8 F (36.6 C)    Ht 5\' 6"  (1.676 m)    Wt 166 lb 12.8 oz (75.7 kg)    SpO2 99%    BMI 26.92 kg/m   GEN: Well nourished, well developed, in no acute distress  Neck: no JVD or masses - no thyromegaly Cardiac: RRR; no murmurs, rubs, or gallops,no edema -  Respiratory:  normal respiratory rate and pattern with no distress - normal breath sounds with no rales, rhonchi, wheezes or rubs GI: normal bowel sounds, no masses or tenderness Skin: warm and dry, no rash    Diabetic Foot Exam - Simple   No data filed      Lab Results  Component Value Date   WBC 5.6 05/22/2021   HGB 16.5 05/22/2021   HCT 49.6 05/22/2021   PLT 128 (L) 05/22/2021   GLUCOSE 96 05/22/2021   CHOL 110 05/03/2021   TRIG 109 05/03/2021   HDL 35 (L)  05/03/2021   LDLCALC 55 05/03/2021   ALT 55 (H) 05/22/2021   AST 35 05/22/2021   NA 143 05/22/2021   K 5.1 05/22/2021   CL 104 05/22/2021   CREATININE 1.16 05/22/2021   BUN 12 05/22/2021   CO2 24 05/22/2021      Assessment & Plan:   Problem List Items Addressed This Visit       Cardiovascular and Mediastinum   Coronary artery disease of native artery of native heart with stable angina pectoris (HCC) - Primary   Relevant Medications   metoprolol tartrate (LOPRESSOR) 25 MG tablet   aspirin EC 81 MG tablet   Other Relevant Orders   CBC with Differential/Platelet   Comprehensive metabolic panel   TSH   Lipid panel     Digestive   Gastroesophageal reflux disease Continue current meds     Other   Pure hypercholesterolemia   Relevant Medications   metoprolol tartrate (LOPRESSOR) 25 MG tablet   aspirin EC 81 MG tablet   Other Relevant Orders   Lipid panel   Other Visit Diagnoses     Need for vaccination for zoster       Relevant Orders   Varicella-zoster vaccine IM (Shingrix)   Prostate cancer screening       Relevant Orders   PSA     .  No orders of the defined types were placed in this encounter.   Orders Placed This Encounter  Procedures   Varicella-zoster vaccine IM (Shingrix)   CBC with Differential/Platelet   Comprehensive metabolic panel   TSH   Lipid panel   PSA     Follow-up: Return in about 6 months (around 05/12/2022) for chronic follow up  --- 8 weeks or later for nurse visit - shingrix #2.  An After Visit Summary was printed and given to the patient.  05/14/2022 [facility] [phone]

## 2021-11-13 ENCOUNTER — Other Ambulatory Visit: Payer: Self-pay | Admitting: Physician Assistant

## 2021-11-13 DIAGNOSIS — R899 Unspecified abnormal finding in specimens from other organs, systems and tissues: Secondary | ICD-10-CM

## 2021-11-13 LAB — CARDIOVASCULAR RISK ASSESSMENT

## 2021-11-13 LAB — COMPREHENSIVE METABOLIC PANEL
ALT: 44 IU/L (ref 0–44)
AST: 30 IU/L (ref 0–40)
Albumin/Globulin Ratio: 2.6 — ABNORMAL HIGH (ref 1.2–2.2)
Albumin: 4.6 g/dL (ref 3.8–4.9)
Alkaline Phosphatase: 92 IU/L (ref 44–121)
BUN/Creatinine Ratio: 13 (ref 9–20)
BUN: 16 mg/dL (ref 6–24)
Bilirubin Total: 0.6 mg/dL (ref 0.0–1.2)
CO2: 25 mmol/L (ref 20–29)
Calcium: 9.4 mg/dL (ref 8.7–10.2)
Chloride: 103 mmol/L (ref 96–106)
Creatinine, Ser: 1.19 mg/dL (ref 0.76–1.27)
Globulin, Total: 1.8 g/dL (ref 1.5–4.5)
Glucose: 106 mg/dL — ABNORMAL HIGH (ref 70–99)
Potassium: 5 mmol/L (ref 3.5–5.2)
Sodium: 143 mmol/L (ref 134–144)
Total Protein: 6.4 g/dL (ref 6.0–8.5)
eGFR: 74 mL/min/{1.73_m2} (ref >59)

## 2021-11-13 LAB — TSH: TSH: 1.15 u[IU]/mL (ref 0.450–4.500)

## 2021-11-13 LAB — CBC WITH DIFFERENTIAL/PLATELET
Basophils Absolute: 0 10*3/uL (ref 0.0–0.2)
Basos: 1 %
EOS (ABSOLUTE): 0.1 10*3/uL (ref 0.0–0.4)
Eos: 1 %
Hematocrit: 46.9 % (ref 37.5–51.0)
Hemoglobin: 16.2 g/dL (ref 13.0–17.7)
Immature Grans (Abs): 0 10*3/uL (ref 0.0–0.1)
Immature Granulocytes: 0 %
Lymphocytes Absolute: 1.5 10*3/uL (ref 0.7–3.1)
Lymphs: 30 %
MCH: 31.2 pg (ref 26.6–33.0)
MCHC: 34.5 g/dL (ref 31.5–35.7)
MCV: 90 fL (ref 79–97)
Monocytes Absolute: 0.5 10*3/uL (ref 0.1–0.9)
Monocytes: 10 %
Neutrophils Absolute: 3 10*3/uL (ref 1.4–7.0)
Neutrophils: 58 %
Platelets: 117 10*3/uL — ABNORMAL LOW (ref 150–450)
RBC: 5.19 x10E6/uL (ref 4.14–5.80)
RDW: 12.4 % (ref 11.6–15.4)
WBC: 5.1 10*3/uL (ref 3.4–10.8)

## 2021-11-13 LAB — LIPID PANEL
Chol/HDL Ratio: 2.4 ratio (ref 0.0–5.0)
Cholesterol, Total: 95 mg/dL — ABNORMAL LOW (ref 100–199)
HDL: 39 mg/dL — ABNORMAL LOW (ref >39)
LDL Chol Calc (NIH): 41 mg/dL (ref 0–99)
Triglycerides: 69 mg/dL (ref 0–149)
VLDL Cholesterol Cal: 15 mg/dL (ref 5–40)

## 2021-11-13 LAB — PSA: Prostate Specific Ag, Serum: 0.7 ng/mL (ref 0.0–4.0)

## 2021-11-19 ENCOUNTER — Other Ambulatory Visit: Payer: Self-pay

## 2021-11-19 MED ORDER — FAMOTIDINE 40 MG PO TABS: ORAL_TABLET | ORAL | 2 refills | Status: DC

## 2021-12-22 DIAGNOSIS — J069 Acute upper respiratory infection, unspecified: Secondary | ICD-10-CM | POA: Diagnosis not present

## 2021-12-22 DIAGNOSIS — R0981 Nasal congestion: Secondary | ICD-10-CM | POA: Diagnosis not present

## 2021-12-22 DIAGNOSIS — J209 Acute bronchitis, unspecified: Secondary | ICD-10-CM | POA: Diagnosis not present

## 2021-12-26 DIAGNOSIS — J209 Acute bronchitis, unspecified: Secondary | ICD-10-CM | POA: Diagnosis not present

## 2022-01-10 ENCOUNTER — Ambulatory Visit (INDEPENDENT_AMBULATORY_CARE_PROVIDER_SITE_OTHER): Payer: BC Managed Care – PPO

## 2022-01-10 DIAGNOSIS — Z23 Encounter for immunization: Secondary | ICD-10-CM | POA: Diagnosis not present

## 2022-01-10 NOTE — Progress Notes (Signed)
? ?  Shingrix vaccine given per order, patient tolerated well.  ? ?Jacklynn Bue, LPN ?02:63 AM ? ?

## 2022-02-13 IMAGING — CT CT ABD-PELV W/ CM
2 of 5 series · 16 of 46 positions shown, 18 images · IV contrast (Omnipaque)
Comparison: None.

CLINICAL DATA: Abdominal pain

EXAM:
CT ABDOMEN AND PELVIS WITH CONTRAST
TECHNIQUE: Multidetector CT imaging of the abdomen and pelvis was performed
using the standard protocol following bolus administration of
intravenous contrast.
CONTRAST:  100mL OMNIPAQUE IOHEXOL 300 MG/ML  SOLN

[Series 2: axial st · axial · 0.80mm/px · z∈[-523,-123]mm · 13 of 91 slices shown, 15 images]
[im 6/91  soft-tissue]
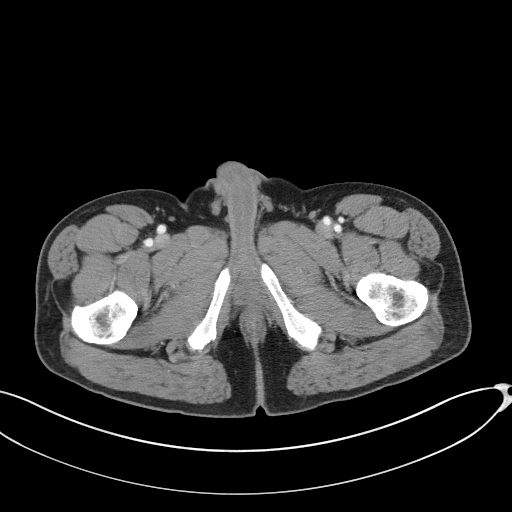
[im 6/91  bone]
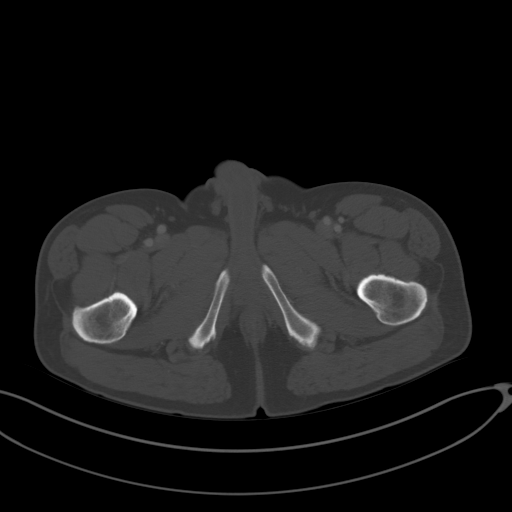
[im 11/91  soft-tissue]
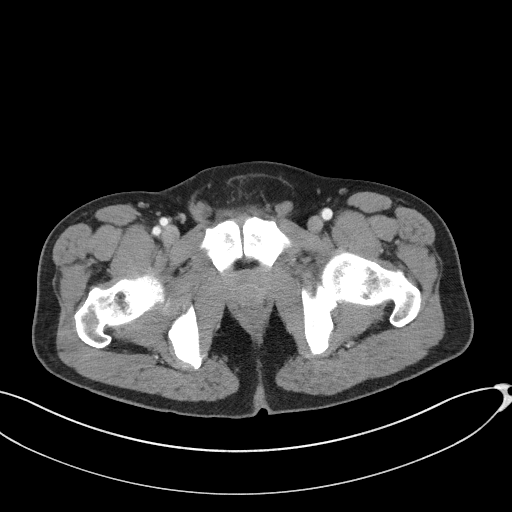
[im 21/91  soft-tissue]
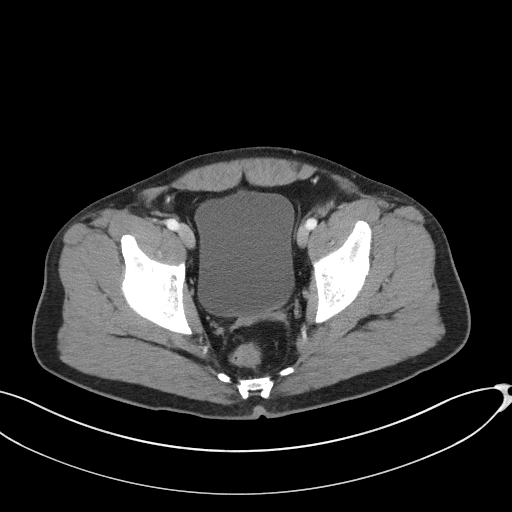
[im 26/91  soft-tissue]
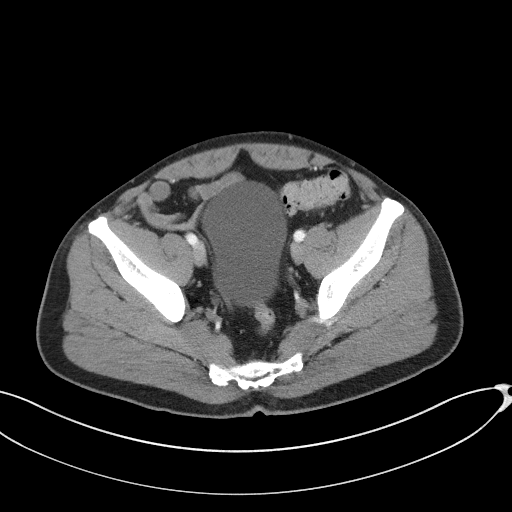
[im 31/91  soft-tissue]
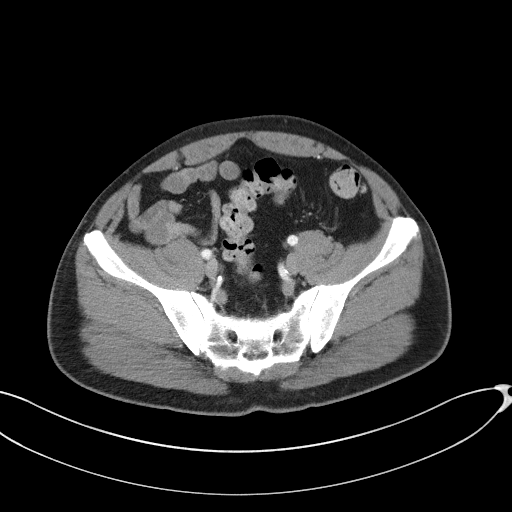
[im 41/91  soft-tissue]
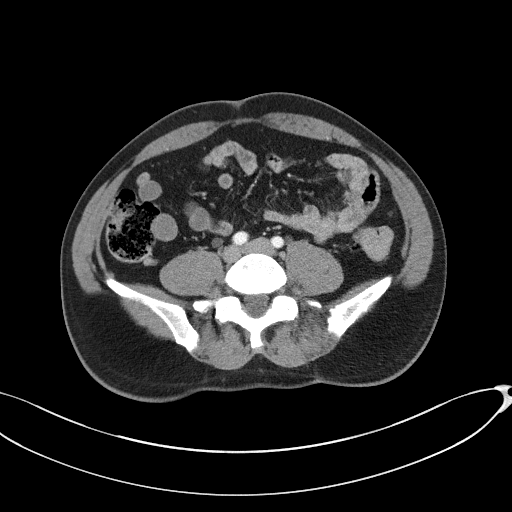
[im 46/91  soft-tissue]
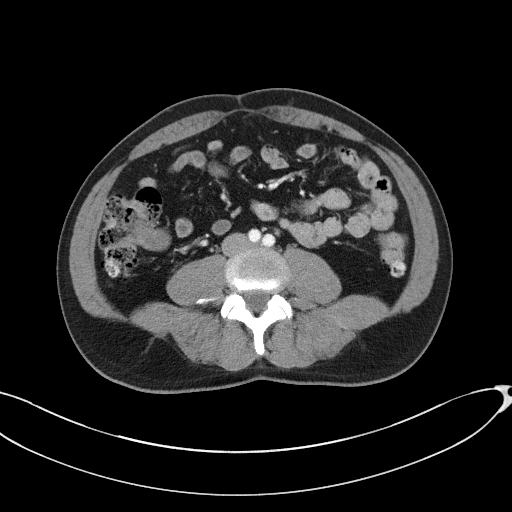
[im 51/91  soft-tissue]
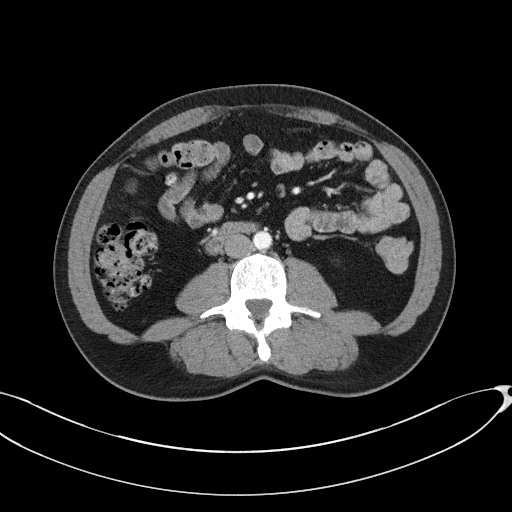
[im 61/91  soft-tissue]
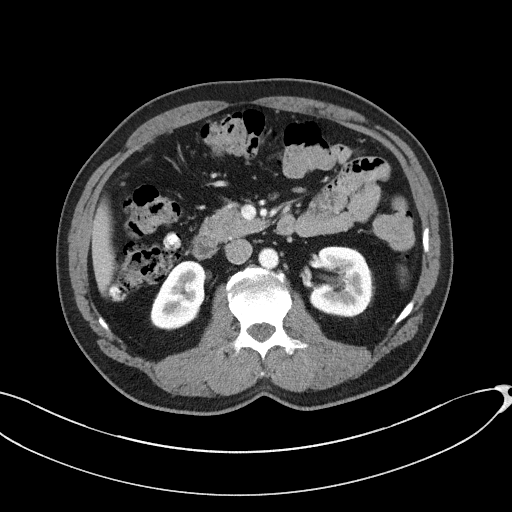
[im 61/91  bone]
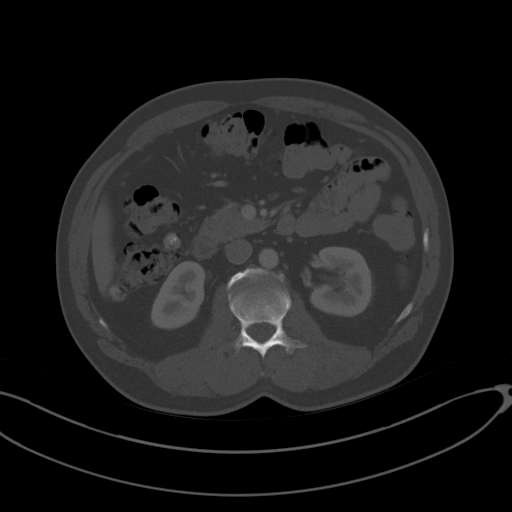
[im 66/91  soft-tissue]
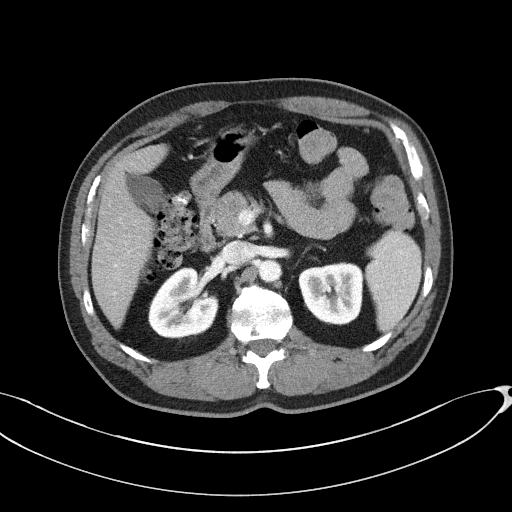
[im 71/91  soft-tissue]
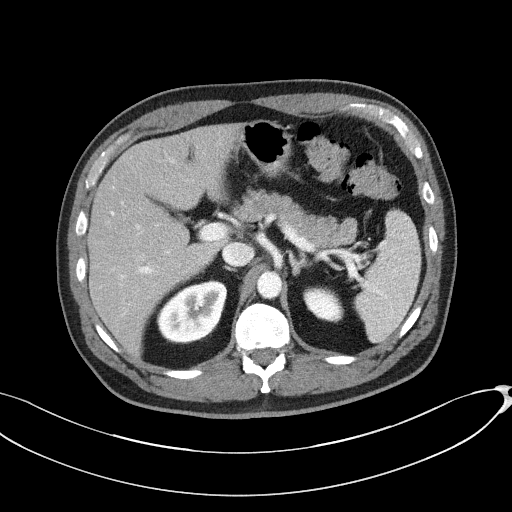
[im 81/91  soft-tissue]
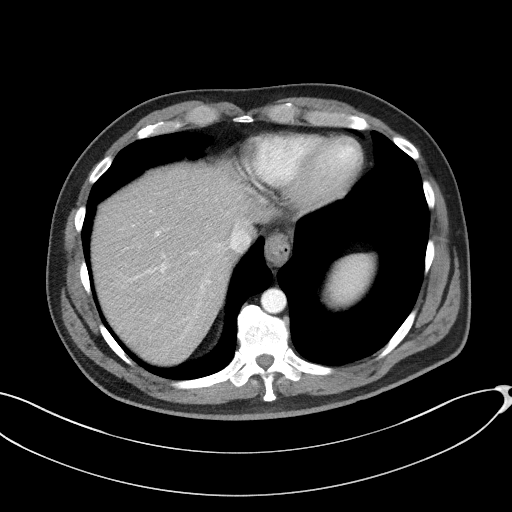
[im 86/91  soft-tissue]
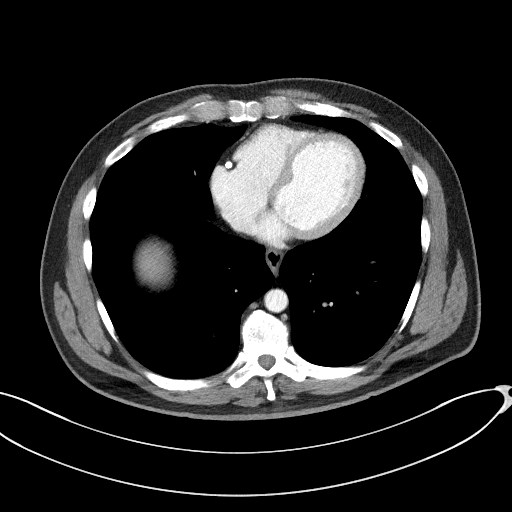

[Series 5: coronal st · coronal · 0.75mm/px · 3 of 99 slices shown]
[im 33/99  soft-tissue]
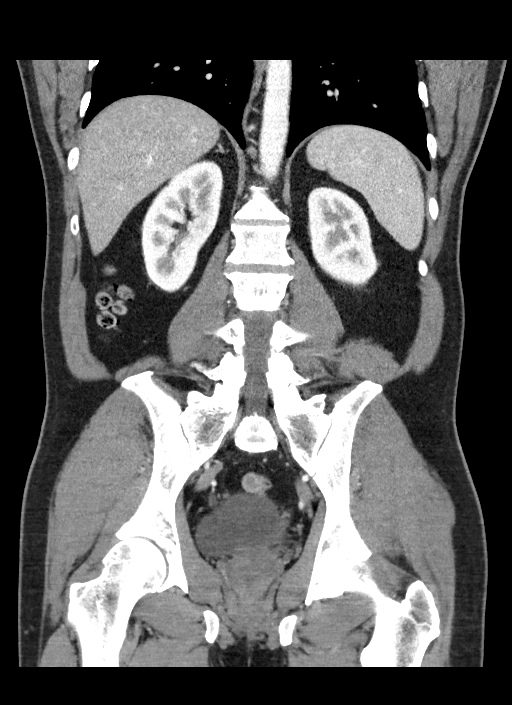
[im 44/99  soft-tissue]
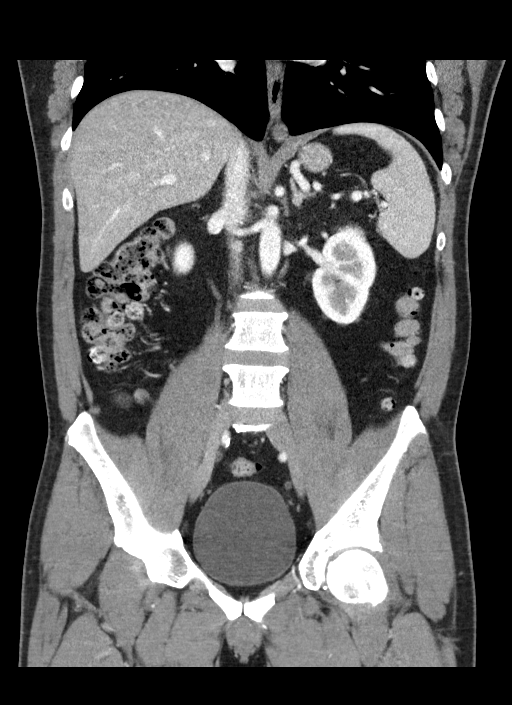
[im 55/99  soft-tissue]
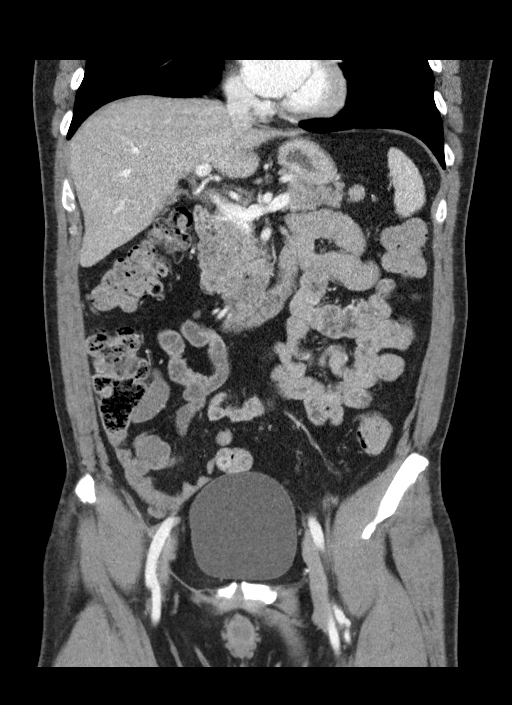

[16 of 46 positions shown; findings below may reference images not displayed]

FINDINGS: LOWER CHEST: Normal.

HEPATOBILIARY: Normal hepatic contours. No intra- or extrahepatic
biliary dilatation. There is cholelithiasis without acute
inflammation.

PANCREAS: Normal pancreas. No ductal dilatation or peripancreatic
fluid collection.

SPLEEN: Normal.

ADRENALS/URINARY TRACT: The adrenal glands are normal. No
hydronephrosis, nephroureterolithiasis or solid renal mass. The
urinary bladder is normal for degree of distention

STOMACH/BOWEL: There is no hiatal hernia. Normal duodenal course and
caliber. No small bowel dilatation or inflammation. Rectosigmoid
diverticulosis without acute inflammation. Normal appendix.

VASCULAR/LYMPHATIC: Normal course and caliber of the major abdominal
vessels. No abdominal or pelvic lymphadenopathy.

REPRODUCTIVE: Normal prostate size with symmetric seminal vesicles.

MUSCULOSKELETAL. No bony spinal canal stenosis or focal osseous
abnormality.

OTHER: None.
IMPRESSION: 1. No acute abnormality of the abdomen or pelvis.
2. Cholelithiasis without acute inflammation.

## 2022-02-21 DIAGNOSIS — I2111 ST elevation (STEMI) myocardial infarction involving right coronary artery: Secondary | ICD-10-CM | POA: Diagnosis not present

## 2022-02-21 DIAGNOSIS — I25118 Atherosclerotic heart disease of native coronary artery with other forms of angina pectoris: Secondary | ICD-10-CM | POA: Diagnosis not present

## 2022-02-21 DIAGNOSIS — E7800 Pure hypercholesterolemia, unspecified: Secondary | ICD-10-CM | POA: Diagnosis not present

## 2022-03-24 DIAGNOSIS — I25118 Atherosclerotic heart disease of native coronary artery with other forms of angina pectoris: Secondary | ICD-10-CM | POA: Diagnosis not present

## 2022-04-09 ENCOUNTER — Other Ambulatory Visit: Payer: Self-pay | Admitting: Physician Assistant

## 2022-04-28 DIAGNOSIS — Z955 Presence of coronary angioplasty implant and graft: Secondary | ICD-10-CM | POA: Diagnosis not present

## 2022-04-28 DIAGNOSIS — E7800 Pure hypercholesterolemia, unspecified: Secondary | ICD-10-CM | POA: Diagnosis not present

## 2022-04-28 DIAGNOSIS — I2111 ST elevation (STEMI) myocardial infarction involving right coronary artery: Secondary | ICD-10-CM | POA: Diagnosis not present

## 2022-04-28 DIAGNOSIS — I25118 Atherosclerotic heart disease of native coronary artery with other forms of angina pectoris: Secondary | ICD-10-CM | POA: Diagnosis not present

## 2022-06-02 ENCOUNTER — Ambulatory Visit: Payer: BC Managed Care – PPO | Admitting: Physician Assistant

## 2022-06-06 DIAGNOSIS — R7989 Other specified abnormal findings of blood chemistry: Secondary | ICD-10-CM | POA: Diagnosis not present

## 2022-06-06 DIAGNOSIS — D696 Thrombocytopenia, unspecified: Secondary | ICD-10-CM | POA: Diagnosis not present

## 2022-06-06 DIAGNOSIS — Z0000 Encounter for general adult medical examination without abnormal findings: Secondary | ICD-10-CM | POA: Diagnosis not present

## 2022-06-06 DIAGNOSIS — Z125 Encounter for screening for malignant neoplasm of prostate: Secondary | ICD-10-CM | POA: Diagnosis not present

## 2022-06-06 DIAGNOSIS — I2510 Atherosclerotic heart disease of native coronary artery without angina pectoris: Secondary | ICD-10-CM | POA: Diagnosis not present

## 2022-06-06 DIAGNOSIS — Z6826 Body mass index (BMI) 26.0-26.9, adult: Secondary | ICD-10-CM | POA: Diagnosis not present

## 2022-06-06 DIAGNOSIS — I10 Essential (primary) hypertension: Secondary | ICD-10-CM | POA: Diagnosis not present

## 2022-06-06 DIAGNOSIS — R7301 Impaired fasting glucose: Secondary | ICD-10-CM | POA: Diagnosis not present

## 2022-06-06 DIAGNOSIS — R739 Hyperglycemia, unspecified: Secondary | ICD-10-CM | POA: Diagnosis not present

## 2022-06-19 DIAGNOSIS — Z1212 Encounter for screening for malignant neoplasm of rectum: Secondary | ICD-10-CM | POA: Diagnosis not present

## 2022-06-19 DIAGNOSIS — Z1211 Encounter for screening for malignant neoplasm of colon: Secondary | ICD-10-CM | POA: Diagnosis not present

## 2022-06-23 ENCOUNTER — Inpatient Hospital Stay: Payer: BC Managed Care – PPO | Attending: Oncology | Admitting: Oncology

## 2022-06-23 ENCOUNTER — Encounter: Payer: Self-pay | Admitting: Oncology

## 2022-06-23 ENCOUNTER — Inpatient Hospital Stay: Payer: BC Managed Care – PPO

## 2022-06-23 VITALS — BP 147/83 | HR 62 | Temp 98.0°F | Resp 18 | Ht 66.2 in | Wt 166.6 lb

## 2022-06-23 DIAGNOSIS — I10 Essential (primary) hypertension: Secondary | ICD-10-CM | POA: Insufficient documentation

## 2022-06-23 DIAGNOSIS — G4730 Sleep apnea, unspecified: Secondary | ICD-10-CM | POA: Insufficient documentation

## 2022-06-23 DIAGNOSIS — I25118 Atherosclerotic heart disease of native coronary artery with other forms of angina pectoris: Secondary | ICD-10-CM | POA: Diagnosis not present

## 2022-06-23 DIAGNOSIS — Z79899 Other long term (current) drug therapy: Secondary | ICD-10-CM | POA: Insufficient documentation

## 2022-06-23 DIAGNOSIS — D696 Thrombocytopenia, unspecified: Secondary | ICD-10-CM | POA: Diagnosis not present

## 2022-06-23 DIAGNOSIS — Z7902 Long term (current) use of antithrombotics/antiplatelets: Secondary | ICD-10-CM | POA: Diagnosis not present

## 2022-06-23 DIAGNOSIS — I252 Old myocardial infarction: Secondary | ICD-10-CM | POA: Insufficient documentation

## 2022-06-23 DIAGNOSIS — Z800 Family history of malignant neoplasm of digestive organs: Secondary | ICD-10-CM | POA: Diagnosis not present

## 2022-06-23 DIAGNOSIS — Z955 Presence of coronary angioplasty implant and graft: Secondary | ICD-10-CM

## 2022-06-23 DIAGNOSIS — I2510 Atherosclerotic heart disease of native coronary artery without angina pectoris: Secondary | ICD-10-CM | POA: Insufficient documentation

## 2022-06-23 DIAGNOSIS — E785 Hyperlipidemia, unspecified: Secondary | ICD-10-CM | POA: Diagnosis not present

## 2022-06-23 DIAGNOSIS — K219 Gastro-esophageal reflux disease without esophagitis: Secondary | ICD-10-CM | POA: Insufficient documentation

## 2022-06-23 DIAGNOSIS — Z8249 Family history of ischemic heart disease and other diseases of the circulatory system: Secondary | ICD-10-CM | POA: Diagnosis not present

## 2022-06-23 LAB — CMP (CANCER CENTER ONLY)
ALT: 36 U/L (ref 0–44)
AST: 26 U/L (ref 15–41)
Albumin: 4.8 g/dL (ref 3.5–5.0)
Alkaline Phosphatase: 74 U/L (ref 38–126)
Anion gap: 9 (ref 5–15)
BUN: 18 mg/dL (ref 6–20)
CO2: 28 mmol/L (ref 22–32)
Calcium: 9.1 mg/dL (ref 8.9–10.3)
Chloride: 103 mmol/L (ref 98–111)
Creatinine: 0.94 mg/dL (ref 0.61–1.24)
GFR, Estimated: >60 mL/min (ref >60)
Glucose, Bld: 97 mg/dL (ref 70–99)
Potassium: 3.6 mmol/L (ref 3.5–5.1)
Sodium: 140 mmol/L (ref 135–145)
Total Bilirubin: 1 mg/dL (ref 0.3–1.2)
Total Protein: 7.3 g/dL (ref 6.5–8.1)

## 2022-06-23 LAB — CBC WITH DIFFERENTIAL (CANCER CENTER ONLY)
Abs Immature Granulocytes: 0.02 10*3/uL (ref 0.00–0.07)
Basophils Absolute: 0 10*3/uL (ref 0.0–0.1)
Basophils Relative: 1 %
Eosinophils Absolute: 0.1 10*3/uL (ref 0.0–0.5)
Eosinophils Relative: 1 %
HCT: 50.7 % (ref 39.0–52.0)
Hemoglobin: 16.9 g/dL (ref 13.0–17.0)
Immature Granulocytes: 0 %
Lymphocytes Relative: 20 %
Lymphs Abs: 1.3 10*3/uL (ref 0.7–4.0)
MCH: 30.9 pg (ref 26.0–34.0)
MCHC: 33.3 g/dL (ref 30.0–36.0)
MCV: 92.7 fL (ref 80.0–100.0)
Monocytes Absolute: 0.5 10*3/uL (ref 0.1–1.0)
Monocytes Relative: 8 %
Neutro Abs: 4.6 10*3/uL (ref 1.7–7.7)
Neutrophils Relative %: 70 %
Platelet Count: 125 10*3/uL — ABNORMAL LOW (ref 150–400)
RBC: 5.47 MIL/uL (ref 4.22–5.81)
RDW: 12.1 % (ref 11.5–15.5)
WBC Count: 6.5 10*3/uL (ref 4.0–10.5)
nRBC: 0 % (ref 0.0–0.2)

## 2022-06-23 LAB — D-DIMER, QUANTITATIVE: D-Dimer, Quant: 0.31 ug/mL-FEU (ref 0.00–0.50)

## 2022-06-23 LAB — DIRECT ANTIGLOBULIN TEST (NOT AT ARMC)
DAT, IgG: NEGATIVE
DAT, complement: NEGATIVE

## 2022-06-23 LAB — APTT: aPTT: 28 seconds (ref 24–36)

## 2022-06-23 LAB — HEPATITIS PANEL, ACUTE
HCV Ab: NONREACTIVE
Hep A IgM: NONREACTIVE
Hep B C IgM: NONREACTIVE
Hepatitis B Surface Ag: NONREACTIVE

## 2022-06-23 LAB — PROTIME-INR
INR: 1 (ref 0.8–1.2)
Prothrombin Time: 13.4 seconds (ref 11.4–15.2)

## 2022-06-23 LAB — FIBRINOGEN: Fibrinogen: 268 mg/dL (ref 210–475)

## 2022-06-23 NOTE — Progress Notes (Signed)
[facility] Initial Visit:  Patient Care Team: Darrol Jump, Hershal Coria as PCP - General (Family Medicine)  CHIEF COMPLAINTS/PURPOSE OF CONSULTATION:  Oncology History   No history exists.    HISTORY OF PRESENTING ILLNESS: Jordan Lewis 51 y.o. male is here because of thrombocytopenia   Medical history notable for coronary artery disease, hypertension, hyperlipidemia, GERD  November 06 2020: WBC 11.9 hemoglobin 16.4 MCV 98 platelet count 116  April 12 2021:  CT AP  HEPATOBILIARY: Normal hepatic contours. No intra- or extrahepatic biliary dilatation. There is cholelithiasis without acute inflammation.   PANCREAS: Normal pancreas. No ductal dilatation or peripancreatic fluid collection.   SPLEEN: Normal.   October 24 2021:  Cardiac ECHO.  LEFT VENTRICLE  The left ventricular size is normal. There is normal left ventricular wall thickness. Left ventricular systolic function is normal. LV  ejection fraction = 55-60%. Normal left ventricular diastolic function and left atrial pressure. The left ventricular wall motion is normal.   November 12 2021: WBC 5.1 hemoglobin 16.2 MCV 90 platelet count 117 PSA 0.7   June 11, 2022 WBC 4.7 hemoglobin 16.0 MCV 92 platelet count 117; 60 segs 30 lymphs 9 monos 1 EO.  Chemistries showed normal albumin, transaminases, creatinine.  B12 greater than 2000 folate 7.5.  Ferritin 73 Hemoglobin A1c 5.7  June 23 2022:  [facility]  Patient notes that PLT has been low since 2017 and typically runs about 117 Does not report bleeding problems.  Formerly had increased bruising while on Effient for a year following MI in November 05 2020 which was treated with stent placement.   Remains on ASA. He has been on atorvastatin since the MI.  Was on Metoprolol later changed to carvedilol due to hypotension.  Has been on Pepcid for 5 yrs.  No bleeding in setting of cardiac cath and stent placement      Cologuard results  pendingc Has mild sleep apnea.    Social:  Married.  Works for [employer] in [employer].  No EtOH or tobacco  [facility] Mother alive 44 recently diagnosed with rectal cancer Father died 31's died CAD  Brother alive 87 well Sister alive 48 endometriosis.    Review of Systems  Constitutional:  Negative for chills, diaphoresis, fatigue, fever and unexpected weight change.  HENT:   Negative for mouth sores, nosebleeds, sore throat and trouble swallowing.   Eyes:  Negative for eye problems and icterus.  Respiratory:  Negative for chest tightness, cough, hemoptysis and shortness of breath.   Cardiovascular:  Negative for chest pain, leg swelling and palpitations.  Gastrointestinal:  Negative for abdominal pain, blood in stool, constipation, diarrhea and nausea.  Genitourinary:  Negative for dysuria, hematuria and nocturia.   Musculoskeletal:  Negative for arthralgias, back pain and myalgias.  Neurological:  Negative for dizziness, extremity weakness, headaches and numbness.  Hematological:  Negative for adenopathy. Does not bruise/bleed easily.  Psychiatric/Behavioral:  Negative for depression, sleep disturbance and suicidal ideas.     MEDICAL HISTORY: Past Medical History:  Diagnosis Date   GERD (gastroesophageal reflux disease)    MI (myocardial infarction) ([place])     SURGICAL HISTORY: Past Surgical History:  Procedure Laterality Date   CARDIAC SURGERY     HERNIA REPAIR  1994    SOCIAL HISTORY: Social History   Socioeconomic History   Marital status: Married    Spouse name: Not on file   Number of children: Not on file   Years of education: Not on file  Highest education level: Not on file  Occupational History   Not on file  Tobacco Use   Smoking status: Never   Smokeless tobacco: Never  Vaping Use   Vaping Use: Never used  Substance and Sexual Activity   Alcohol use: Never   Drug use: Never   Sexual activity: Not on file  Other Topics Concern   Not on file  Social History  Narrative   Not on file   Social Determinants of Health   Financial Resource Strain: Not on file  Food Insecurity: Not on file  Transportation Needs: Not on file  Physical Activity: Not on file  Stress: Not on file  Social Connections: Not on file  Intimate Partner Violence: Not on file    FAMILY HISTORY Family History  Problem Relation Age of Onset   Heart disease Father     ALLERGIES:  has No Known Allergies.  MEDICATIONS:  Current Outpatient Medications  Medication Sig Dispense Refill   aspirin EC 81 MG tablet Take 81 mg by mouth daily. Swallow whole.     atorvastatin (LIPITOR) 80 MG tablet Take by mouth.     famotidine (PEPCID) 40 MG tablet TAKE 1 TABLET BY MOUTH 30 MINUTES BEFORE THE MORNING AND EVENING MEAL 60 tablet 2   metoprolol tartrate (LOPRESSOR) 25 MG tablet Take 25 mg by mouth in the morning and at bedtime.     prasugrel (EFFIENT) 10 MG TABS tablet Take 10 mg by mouth daily.     No current facility-administered medications for this visit.    PHYSICAL EXAMINATION:  ECOG PERFORMANCE STATUS: 1 - Symptomatic but completely ambulatory   There were no vitals filed for this visit.  There were no vitals filed for this visit.   Physical Exam   LABORATORY DATA: I have personally reviewed the data as listed:  No visits with results within 1 Month(s) from this visit.  Latest known visit with results is:  Office Visit on 11/12/2021  Component Date Value Ref Range Status   WBC 11/12/2021 5.1  3.4 - 10.8 x10E3/uL Final   RBC 11/12/2021 5.19  4.14 - 5.80 x10E6/uL Final   Hemoglobin 11/12/2021 16.2  13.0 - 17.7 g/dL Final   Hematocrit 11/12/2021 46.9  37.5 - 51.0 % Final   MCV 11/12/2021 90  79 - 97 fL Final   MCH 11/12/2021 31.2  26.6 - 33.0 pg Final   MCHC 11/12/2021 34.5  31.5 - 35.7 g/dL Final   RDW 11/12/2021 12.4  11.6 - 15.4 % Final   Platelets 11/12/2021 117 (L)  150 - 450 x10E3/uL Final   Neutrophils 11/12/2021 58  Not Estab. % Final   Lymphs  11/12/2021 30  Not Estab. % Final   Monocytes 11/12/2021 10  Not Estab. % Final   Eos 11/12/2021 1  Not Estab. % Final   Basos 11/12/2021 1  Not Estab. % Final   Neutrophils Absolute 11/12/2021 3.0  1.4 - 7.0 x10E3/uL Final   Lymphocytes Absolute 11/12/2021 1.5  0.7 - 3.1 x10E3/uL Final   Monocytes Absolute 11/12/2021 0.5  0.1 - 0.9 x10E3/uL Final   EOS (ABSOLUTE) 11/12/2021 0.1  0.0 - 0.4 x10E3/uL Final   Basophils Absolute 11/12/2021 0.0  0.0 - 0.2 x10E3/uL Final   Immature Granulocytes 11/12/2021 0  Not Estab. % Final   Immature Grans (Abs) 11/12/2021 0.0  0.0 - 0.1 x10E3/uL Final   Glucose 11/12/2021 106 (H)  70 - 99 mg/dL Final   BUN 11/12/2021 16  6 - 24  mg/dL Final   Creatinine, Ser 11/12/2021 1.19  0.76 - 1.27 mg/dL Final   eGFR 11/12/2021 74  >59 mL/min/1.73 Final   BUN/Creatinine Ratio 11/12/2021 13  9 - 20 Final   Sodium 11/12/2021 143  134 - 144 mmol/L Final   Potassium 11/12/2021 5.0  3.5 - 5.2 mmol/L Final   Chloride 11/12/2021 103  96 - 106 mmol/L Final   CO2 11/12/2021 25  20 - 29 mmol/L Final   Calcium 11/12/2021 9.4  8.7 - 10.2 mg/dL Final   Total Protein 11/12/2021 6.4  6.0 - 8.5 g/dL Final   Albumin 11/12/2021 4.6  3.8 - 4.9 g/dL Final   Globulin, Total 11/12/2021 1.8  1.5 - 4.5 g/dL Final   Albumin/Globulin Ratio 11/12/2021 2.6 (H)  1.2 - 2.2 Final   Bilirubin Total 11/12/2021 0.6  0.0 - 1.2 mg/dL Final   Alkaline Phosphatase 11/12/2021 92  44 - 121 IU/L Final   AST 11/12/2021 30  0 - 40 IU/L Final   ALT 11/12/2021 44  0 - 44 IU/L Final   TSH 11/12/2021 1.150  0.450 - 4.500 uIU/mL Final   Cholesterol, Total 11/12/2021 95 (L)  100 - 199 mg/dL Final   Triglycerides 11/12/2021 69  0 - 149 mg/dL Final   HDL 11/12/2021 39 (L)  >39 mg/dL Final   VLDL Cholesterol Cal 11/12/2021 15  5 - 40 mg/dL Final   LDL Chol Calc (NIH) 11/12/2021 41  0 - 99 mg/dL Final   Chol/HDL Ratio 11/12/2021 2.4  0.0 - 5.0 ratio Final   Comment:                                   T. Chol/HDL  Ratio                                             Men  Women                               1/2 Avg.Risk  3.4    3.3                                   Avg.Risk  5.0    4.4                                2X Avg.Risk  9.6    7.1                                3X Avg.Risk 23.4   11.0    Prostate Specific Ag, Serum 11/12/2021 0.7  0.0 - 4.0 ng/mL Final   Comment: Roche ECLIA methodology. According to the American Urological Association, Serum PSA should decrease and remain at undetectable levels after radical prostatectomy. The AUA defines biochemical recurrence as an initial PSA value 0.2 ng/mL or greater followed by a subsequent confirmatory PSA value 0.2 ng/mL or greater. Values obtained with different assay methods or kits cannot be used interchangeably. Results cannot be interpreted as absolute evidence of the presence or absence of malignant disease.  Interpretation 11/12/2021 Note   Final   Supplemental report is available.    RADIOGRAPHIC STUDIES: I have personally reviewed the radiological images as listed and agree with the findings in the report  No results found.  ASSESSMENT/PLAN 51 year old male with mild thrombocytopenia  Thrombocytopenia:  Possible causes in this patient   Decreased Production   Bone marrow failure Aplastic anemia PNH Schwann-Diamond Syndrome   Bone marrow malignancies AML MDS MPD, Myeloma PNH   Bone marrow suppression Drug induced Chemotherapy Heparin, Quinine, Sulfonamides, Acetaminophen, Carbamezapine, Cephalosporins Chorpromazine, Cimetidine, Ibuprofen, Naproxen Penicillins, Ribampin,Vancomycin Glycoprotein IIb/IIIa inhibitors (abciximab [ReoPro], tirofiban [Aggrastat], eptifibatide [Integrilin])  EtOH    Irradiation   Congenital Bernard-Soulier Syndrome Fanconi anemia Platelet type or pseudo von-Willebrand disease Wiskott-Aldrich Syndrome  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    Infection Bacterial:  Leptospirosis, brucellosis, anaplasmosis    Viral    Mycobacterial: TB, MAC    Parasitic:  Babesiosis, Malaria   Neoplastic marrow infiltration Solid tumor    Lymphoid   Nutritional Deficiencies Vitamin B12, Folate, Copper  Increased Destruction   Autoimmune Syndrome ITP, APLAS, Evans syndrome SLE, RA, Sarcoid   DIC Infection, Malignancy   Drug induced Cephalosporins, PCN, Vancomycin   Infection Bacterial Fungal Mycobacterial Parasitic Malaria Viral  CMV, Covid 19, EBV, Hep B, Hep C, HIV, Mumps, parvo B19, Rubella VZV, Zika   Microangiopathic Hemolytic anemia    Mechanical Destruction    Thrombosis DVT, PE, CVA, Chronic DIC  Sequestration   Hypersplenism    Liver Disease Cirrhosis Fibrosis Steatohepatitis Portal HTN   Pulmonary HTN   Pseudothrombocytopenia   Antibody induced Multiple Myeloma Cold Agglutinin   Spurious EDTA induced clumping Inadequately specimen anticoagulation Giant platelets counted as WBC's    Evaluation:  Obtain CBC with diff, CMP, smear for morphology, SPEP with IEP, free light chains, B12, Folate, DAT, Haptoglobin, Hepatitis and HIV serologies, PT/PTT, D dimer, ANA and RF.      Prasugrel:  Hematologic & oncologic: Leukopenia (3%), anemia (2%), major hemorrhage (2%), minor hemorrhage (2%), major hemorrhage (life-threatening: 1%)  <1%, postmarketing, and/or case reports: Abnormal hepatic function tests, anaphylaxis, angioedema, hematoma, hemoptysis, hemorrhage (requiring inotropes or transfusion), hypersensitivity reaction, intracranial hemorrhage (symptomatic), re-operation due to bleeding, thrombocytopenia, thrombotic thrombocytopenic purpura   Atorvostatin:  Hematologic & oncologic: Immune thrombocytopenia Irish Lack 2010)   Cancer Staging  No matching staging information was found for the patient.   No problem-specific Assessment & Plan notes found for this encounter.   No orders of the defined types were placed in this  encounter.   All questions were answered. The patient knows to call the clinic with any problems, questions or concerns.  This note was electronically signed.    Barbee Cough, MD  06/23/2022 8:26 AM

## 2022-06-24 LAB — KAPPA/LAMBDA LIGHT CHAINS
Kappa free light chain: 13 mg/L (ref 3.3–19.4)
Kappa, lambda light chain ratio: 1.41 (ref 0.26–1.65)
Lambda free light chains: 9.2 mg/L (ref 5.7–26.3)

## 2022-06-24 LAB — BETA-2-GLYCOPROTEIN I ABS, IGG/M/A
Beta-2 Glyco I IgG: <9 GPI IgG units (ref 0–20)
Beta-2-Glycoprotein I IgA: <9 GPI IgA units (ref 0–25)
Beta-2-Glycoprotein I IgM: <9 GPI IgM units (ref 0–32)

## 2022-06-24 LAB — RHEUMATOID FACTOR: Rheumatoid fact SerPl-aCnc: <10 IU/mL (ref <14.0)

## 2022-06-24 LAB — ANA COMPREHENSIVE PANEL
Anti JO-1: <0.2 AI (ref 0.0–0.9)
Centromere Ab Screen: <0.2 AI (ref 0.0–0.9)
Chromatin Ab SerPl-aCnc: <0.2 AI (ref 0.0–0.9)
ENA SM Ab Ser-aCnc: <0.2 AI (ref 0.0–0.9)
Ribonucleic Protein: 0.3 AI (ref 0.0–0.9)
SSA (Ro) (ENA) Antibody, IgG: <0.2 AI (ref 0.0–0.9)
SSB (La) (ENA) Antibody, IgG: <0.2 AI (ref 0.0–0.9)
Scleroderma (Scl-70) (ENA) Antibody, IgG: <0.2 AI (ref 0.0–0.9)
ds DNA Ab: 3 IU/mL (ref 0–9)

## 2022-06-24 LAB — HAPTOGLOBIN: Haptoglobin: 82 mg/dL (ref 29–370)

## 2022-06-24 LAB — LUPUS ANTICOAGULANT PANEL
DRVVT: 34 s (ref 0.0–47.0)
PTT Lupus Anticoagulant: 35 s (ref 0.0–43.5)

## 2022-06-24 LAB — HIV ANTIBODY (ROUTINE TESTING W REFLEX): HIV Screen 4th Generation wRfx: NONREACTIVE

## 2022-06-24 LAB — CARDIOLIPIN ANTIBODIES, IGM+IGG
Anticardiolipin IgG: <9 GPL U/mL (ref 0–14)
Anticardiolipin IgM: <9 MPL U/mL (ref 0–12)

## 2022-06-26 LAB — COPPER, SERUM: Copper: 82 ug/dL (ref 69–132)

## 2022-06-27 LAB — MULTIPLE MYELOMA PANEL, SERUM
Albumin SerPl Elph-Mcnc: 4.1 g/dL (ref 2.9–4.4)
Albumin/Glob SerPl: 1.8 — ABNORMAL HIGH (ref 0.7–1.7)
Alpha 1: 0.2 g/dL (ref 0.0–0.4)
Alpha2 Glob SerPl Elph-Mcnc: 0.6 g/dL (ref 0.4–1.0)
B-Globulin SerPl Elph-Mcnc: 0.8 g/dL (ref 0.7–1.3)
Gamma Glob SerPl Elph-Mcnc: 0.8 g/dL (ref 0.4–1.8)
Globulin, Total: 2.4 g/dL (ref 2.2–3.9)
IgA: 129 mg/dL (ref 90–386)
IgG (Immunoglobin G), Serum: 778 mg/dL (ref 603–1613)
IgM (Immunoglobulin M), Srm: 87 mg/dL (ref 20–172)
Total Protein ELP: 6.5 g/dL (ref 6.0–8.5)

## 2022-07-03 ENCOUNTER — Telehealth: Payer: Self-pay

## 2022-07-03 NOTE — Patient Outreach (Signed)
  Care Coordination   Initial Visit Note   07/03/2022 Name: Jordan Lewis MRN: [id number] DOB: 02-20-1971  Jordan Lewis is a 51 y.o. year old male who sees Darrol Jump, [place] for primary care. I spoke with  Anne Fu by phone today.  What matters to the patients health and wellness today?  Placed call to patient to explain and offer [facility] care coordination program. Patient has changed PCP and is now seeing Darrol Jump who is no longer a provider at [facility].    SDOH assessments and interventions completed:  No     Care Coordination Interventions Activated:  No  Care Coordination Interventions:  No, not indicated   Follow up plan: No further intervention required.   Encounter Outcome:  Pt. Refused   Tomasa Rand, RN, BSN, CEN [facility] [employer] [phone]

## 2022-07-08 ENCOUNTER — Inpatient Hospital Stay: Payer: BC Managed Care – PPO | Admitting: Oncology

## 2022-07-08 ENCOUNTER — Encounter: Payer: Self-pay | Admitting: Oncology

## 2022-07-08 ENCOUNTER — Inpatient Hospital Stay: Payer: BC Managed Care – PPO

## 2022-07-08 VITALS — BP 138/74 | HR 67 | Temp 98.0°F | Resp 16 | Ht 66.2 in | Wt 167.3 lb

## 2022-07-08 DIAGNOSIS — I10 Essential (primary) hypertension: Secondary | ICD-10-CM | POA: Diagnosis not present

## 2022-07-08 DIAGNOSIS — Z7902 Long term (current) use of antithrombotics/antiplatelets: Secondary | ICD-10-CM | POA: Diagnosis not present

## 2022-07-08 DIAGNOSIS — D696 Thrombocytopenia, unspecified: Secondary | ICD-10-CM

## 2022-07-08 DIAGNOSIS — G4730 Sleep apnea, unspecified: Secondary | ICD-10-CM | POA: Diagnosis not present

## 2022-07-08 DIAGNOSIS — Z8249 Family history of ischemic heart disease and other diseases of the circulatory system: Secondary | ICD-10-CM | POA: Diagnosis not present

## 2022-07-08 DIAGNOSIS — I2510 Atherosclerotic heart disease of native coronary artery without angina pectoris: Secondary | ICD-10-CM | POA: Diagnosis not present

## 2022-07-08 DIAGNOSIS — I252 Old myocardial infarction: Secondary | ICD-10-CM | POA: Diagnosis not present

## 2022-07-08 DIAGNOSIS — K219 Gastro-esophageal reflux disease without esophagitis: Secondary | ICD-10-CM | POA: Diagnosis not present

## 2022-07-08 DIAGNOSIS — E785 Hyperlipidemia, unspecified: Secondary | ICD-10-CM | POA: Diagnosis not present

## 2022-07-08 DIAGNOSIS — Z79899 Other long term (current) drug therapy: Secondary | ICD-10-CM | POA: Diagnosis not present

## 2022-07-08 DIAGNOSIS — Z800 Family history of malignant neoplasm of digestive organs: Secondary | ICD-10-CM | POA: Diagnosis not present

## 2022-07-08 LAB — CBC WITH DIFFERENTIAL (CANCER CENTER ONLY)
Abs Immature Granulocytes: 0.01 10*3/uL (ref 0.00–0.07)
Basophils Absolute: 0 10*3/uL (ref 0.0–0.1)
Basophils Relative: 1 %
Eosinophils Absolute: 0.1 10*3/uL (ref 0.0–0.5)
Eosinophils Relative: 1 %
HCT: 48.1 % (ref 39.0–52.0)
Hemoglobin: 16.1 g/dL (ref 13.0–17.0)
Immature Granulocytes: 0 %
Lymphocytes Relative: 26 %
Lymphs Abs: 1.4 10*3/uL (ref 0.7–4.0)
MCH: 30.7 pg (ref 26.0–34.0)
MCHC: 33.5 g/dL (ref 30.0–36.0)
MCV: 91.6 fL (ref 80.0–100.0)
Monocytes Absolute: 0.5 10*3/uL (ref 0.1–1.0)
Monocytes Relative: 10 %
Neutro Abs: 3.4 10*3/uL (ref 1.7–7.7)
Neutrophils Relative %: 62 %
Platelet Count: 126 10*3/uL — ABNORMAL LOW (ref 150–400)
RBC: 5.25 MIL/uL (ref 4.22–5.81)
RDW: 12.3 % (ref 11.5–15.5)
WBC Count: 5.4 10*3/uL (ref 4.0–10.5)
nRBC: 0 % (ref 0.0–0.2)

## 2022-07-08 LAB — VITAMIN B12: Vitamin B-12: 1374 pg/mL — ABNORMAL HIGH (ref 180–914)

## 2022-07-08 LAB — FOLATE: Folate: 12.1 ng/mL (ref >5.9)

## 2022-07-08 NOTE — Progress Notes (Signed)
[facility]  Visit:  Patient Care Team: Darrol Jump, Hershal Coria as PCP - General (Family Medicine)  CHIEF COMPLAINTS/PURPOSE OF CONSULTATION:  Oncology History   No history exists.    HISTORY OF PRESENTING ILLNESS: Jordan Lewis 51 y.o. male is here because of thrombocytopenia   Medical history notable for coronary artery disease, hypertension, hyperlipidemia, GERD  November 06 2020: WBC 11.9 hemoglobin 16.4 MCV 98 platelet count 116  April 12 2021:  CT AP  HEPATOBILIARY: Normal hepatic contours. No intra- or extrahepatic biliary dilatation. There is cholelithiasis without acute inflammation.  PANCREAS: Normal pancreas. No ductal dilatation or peripancreatic fluid collection.  SPLEEN: Normal.  October 24 2021:  Cardiac ECHO.  LEFT VENTRICLE  The left ventricular size is normal. There is normal left ventricular wall thickness. Left ventricular systolic function is normal. LV  ejection fraction = 55-60%. Normal left ventricular diastolic function and left atrial pressure. The left ventricular wall motion is normal.   November 12 2021: WBC 5.1 hemoglobin 16.2 MCV 90 platelet count 117 PSA 0.7  June 11, 2022 WBC 4.7 hemoglobin 16.0 MCV 92 platelet count 117; 60 segs 30 lymphs 9 monos 1 EO.  Chemistries showed normal albumin, transaminases, creatinine.  B12 greater than 2000 folate 7.5.  Ferritin 73 Hemoglobin A1c 5.7  June 23 2022:  [facility]  Patient notes that PLT has been low since 2017 and typically runs about 117 Does not report bleeding problems.  Formerly had increased bruising while on Effient for a year following MI in November 05 2020 which was treated with stent placement.   Remains on ASA. He has been on atorvastatin since the MI.  Was on Metoprolol later changed to carvedilol due to hypotension.  Has been on Pepcid for 5 yrs.  No bleeding in setting of cardiac cath and stent placement      Cologuard results pending.   Has  mild sleep apnea.    Social:  Married.  Works for [employer] in [employer].  No EtOH or tobacco  [facility] Mother alive 34 recently diagnosed with rectal cancer Father died 9's died CAD  Brother alive 52 well Sister alive 9 endometriosis.    WBC 6.5 hemoglobin 16.9 MCV 93 platelet count 125; 70 segs 20 lymphs 8 monos 1 EO 1 basophil CMP normal Copper 82 Haptoglobin 82 Coombs test negative INR 1.0 PTT 28 D-dimer 0.31 Fibrinogen 268 Antibeta 2 glycoprotein antibodies negative anticardiolipin antibody negative Lupus anticoagulant testing negative ANA comprehensive panel negative.  Rheumatoid factor negative  SPEP with IEP negative.  Serum free kappa 13 lambda 9.4 with a kappa lambda 1.41.  Hepatitis ABC serologies negative.  HIV negative  July 08 2022: Scheduled follow up regarding thrombocytopenia Reviewed results of labs with patient and his wife.  Overall he feels well.  He continues to have no issues with bleeding  Review of Systems  Constitutional:  Negative for chills, diaphoresis, fatigue, fever and unexpected weight change.  HENT:   Negative for mouth sores, nosebleeds, sore throat and trouble swallowing.   Eyes:  Negative for eye problems and icterus.  Respiratory:  Negative for chest tightness, cough, hemoptysis and shortness of breath.   Cardiovascular:  Negative for chest pain, leg swelling and palpitations.  Gastrointestinal:  Negative for abdominal pain, blood in stool, constipation, diarrhea and nausea.  Genitourinary:  Negative for dysuria, hematuria and nocturia.   Musculoskeletal:  Negative for arthralgias, back pain and myalgias.  Neurological:  Negative for dizziness, extremity weakness, headaches  and numbness.  Hematological:  Negative for adenopathy. Does not bruise/bleed easily.  Psychiatric/Behavioral:  Negative for depression, sleep disturbance and suicidal ideas.     MEDICAL HISTORY: Past Medical History:  Diagnosis Date   CAD (coronary artery disease)    GERD  (gastroesophageal reflux disease)    Hyperlipidemia    Hypertension    MI (myocardial infarction) ([place])     SURGICAL HISTORY: Past Surgical History:  Procedure Laterality Date   CARDIAC SURGERY     HERNIA REPAIR  1994    SOCIAL HISTORY: Social History   Socioeconomic History   Marital status: Married    Spouse name: Not on file   Number of children: 0   Years of education: Not on file   Highest education level: Master's degree (e.g., MA, MS, MEng, MEd, MSW, MBA)  Occupational History   Occupation: Librarian, academic  Tobacco Use   Smoking status: Never   Smokeless tobacco: Former    Quit date: 1994  Scientific laboratory technician Use: Never used  Substance and Sexual Activity   Alcohol use: Yes    Comment: rarely   Drug use: Never   Sexual activity: Not on file  Other Topics Concern   Not on file  Social History Narrative   Not on file   Social Determinants of Health   Financial Resource Strain: Not on file  Food Insecurity: Not on file  Transportation Needs: Not on file  Physical Activity: Not on file  Stress: Not on file  Social Connections: Not on file  Intimate Partner Violence: Not on file    FAMILY HISTORY Family History  Problem Relation Age of Onset   Rectal cancer Mother    Heart disease Father     ALLERGIES:  has No Known Allergies.  MEDICATIONS:  Current Outpatient Medications  Medication Sig Dispense Refill   aspirin EC 81 MG tablet Take 81 mg by mouth daily. Swallow whole.     atorvastatin (LIPITOR) 80 MG tablet Take by mouth.     carvedilol (COREG) 3.125 MG tablet Take 3.125 mg by mouth 2 (two) times daily.     Cholecalciferol (VITAMIN D-1000 MAX ST) 25 MCG (1000 UT) tablet Take by mouth.     cyanocobalamin (VITAMIN B12) 1000 MCG tablet Take 1 tablet by mouth daily.     famotidine (PEPCID) 40 MG tablet TAKE 1 TABLET BY MOUTH 30 MINUTES BEFORE THE MORNING AND EVENING MEAL 60 tablet 2   Multiple Vitamins-Minerals (ZINC PO) Take 24 drops by  mouth daily.     Omega-3 Fatty Acids (FISH OIL) 1000 MG CAPS Take by mouth.     Turmeric (QC TUMERIC COMPLEX PO) Take by mouth.     No current facility-administered medications for this visit.    PHYSICAL EXAMINATION:  ECOG PERFORMANCE STATUS: 1 - Symptomatic but completely ambulatory   Vitals:   07/08/22 0856  BP: 138/74  Pulse: 67  Resp: 16  Temp: 98 F (36.7 C)  SpO2: 99%    Filed Weights   07/08/22 0856  Weight: 167 lb 4.8 oz (75.9 kg)     Physical Exam Vitals and nursing note reviewed.  Constitutional:      Appearance: Normal appearance. He is not diaphoretic.  HENT:     Head: Normocephalic and atraumatic.     Right Ear: External ear normal.     Left Ear: External ear normal.     Nose: Nose normal.  Eyes:     Conjunctiva/sclera: Conjunctivae normal.  Pupils: Pupils are equal, round, and reactive to light.  Cardiovascular:     Rate and Rhythm: Normal rate and regular rhythm.     Heart sounds:     No friction rub. No gallop.  Musculoskeletal:     Cervical back: Normal range of motion and neck supple. No rigidity or tenderness.  Lymphadenopathy:     Head:     Right side of head: No submental, submandibular, tonsillar, preauricular, posterior auricular or occipital adenopathy.     Left side of head: No submental, submandibular, tonsillar, preauricular, posterior auricular or occipital adenopathy.     Cervical: No cervical adenopathy.     Right cervical: No superficial, deep or posterior cervical adenopathy.    Left cervical: No superficial, deep or posterior cervical adenopathy.     Upper Body:     Right upper body: No supraclavicular or axillary adenopathy.     Left upper body: No supraclavicular or axillary adenopathy.     Lower Body: No right inguinal adenopathy. No left inguinal adenopathy.  Skin:    Coloration: Skin is not jaundiced.  Neurological:     General: No focal deficit present.     Mental Status: He is alert and oriented to person, place,  and time. Mental status is at baseline.  Psychiatric:        Mood and Affect: Mood normal.        Behavior: Behavior normal.        Thought Content: Thought content normal.        Judgment: Judgment normal.     LABORATORY DATA: I have personally reviewed the data as listed:  Appointment on 06/23/2022  Component Date Value Ref Range Status   Fibrinogen 06/23/2022 268  210 - 475 mg/dL Final   Comment: (NOTE) Fibrinogen results may be underestimated in patients receiving thrombolytic therapy. Performed at [facility], [place] [address], [place], [place] [address]    PTT Lupus Anticoagulant 06/23/2022 35.0  0.0 - 43.5 sec Final   DRVVT 06/23/2022 34.0  0.0 - 47.0 sec Final   Lupus Anticoag Interp 06/23/2022 Comment:   Corrected   Comment: (NOTE) No lupus anticoagulant was detected. Performed At: [facility] [place], [place] [id number] Rush Farmer MD OI:[id number]    Anticardiolipin IgG 06/23/2022 <9  0 - 14 GPL U/mL Final   Comment: (NOTE)                          Negative:              <15                          Indeterminate:     15 - 20                          Low-Med Positive: >20 - 80                          High Positive:         >80    Anticardiolipin IgM 06/23/2022 <9  0 - 12 MPL U/mL Final   Comment: (NOTE)                          Negative:              <  13                          Indeterminate:     13 - 20                          Low-Med Positive: >20 - 80                          High Positive:         >80 Performed At: [facility] [place], [place] [id number] Rush Farmer MD TM:[id number]    Beta-2 Glyco I IgG 06/23/2022 <9  0 - 20 GPI IgG units Final   Comment: (NOTE) The reference interval reflects a 3SD or 99th percentile interval, which is thought to represent a potentially clinically significant result in accordance with the International Consensus Statement on the  classification criteria for definitive antiphospholipid syndrome (APS). J Thromb Haem 2006;4:295-306.    Beta-2-Glycoprotein I IgM 06/23/2022 <9  0 - 32 GPI IgM units Final   Comment: (NOTE) The reference interval reflects a 3SD or 99th percentile interval, which is thought to represent a potentially clinically significant result in accordance with the International Consensus Statement on the classification criteria for definitive antiphospholipid syndrome (APS). J Thromb Haem 2006;4:295-306. Performed At: [facility] [place], [place] [id number] Rush Farmer MD YH:[id number]    Beta-2-Glycoprotein I IgA 06/23/2022 <9  0 - 25 GPI IgA units Final   Comment: (NOTE) The reference interval reflects a 3SD or 99th percentile interval, which is thought to represent a potentially clinically significant result in accordance with the International Consensus Statement on the classification criteria for definitive antiphospholipid syndrome (APS). J Thromb Haem 2006;4:295-306.    ds DNA Ab 06/23/2022 3  0 - 9 IU/mL Final   Comment: (NOTE)                                   Negative      <5                                   Equivocal  5 - 9                                   Positive      >9    Ribonucleic Protein 06/23/2022 0.3  0.0 - 0.9 AI Final   ENA SM Ab Ser-aCnc 06/23/2022 <0.2  0.0 - 0.9 AI Final   Scleroderma (Scl-70) (ENA) Antibod* 06/23/2022 <0.2  0.0 - 0.9 AI Final   SSA (Ro) (ENA) Antibody, IgG 06/23/2022 <0.2  0.0 - 0.9 AI Final   SSB (La) (ENA) Antibody, IgG 06/23/2022 <0.2  0.0 - 0.9 AI Final   Chromatin Ab SerPl-aCnc 06/23/2022 <0.2  0.0 - 0.9 AI Final   Anti JO-1 06/23/2022 <0.2  0.0 - 0.9 AI Final   Centromere Ab Screen 06/23/2022 <0.2  0.0 - 0.9 AI Final   See below: 06/23/2022 Comment   Final   Comment: (NOTE) Autoantibody                       Disease Association ------------------------------------------------------------  Condition                  Frequency ---------------------   ------------------------   --------- Antinuclear Antibody,    SLE, mixed connective Direct (ANA-D)           tissue diseases ---------------------   ------------------------   --------- dsDNA                    SLE                        40 - 60% ---------------------   ------------------------   --------- Chromatin                Drug induced SLE                90%                         SLE                        48 - 97% ---------------------   ------------------------   --------- SSA (Ro)                 SLE                        25 - 35%                         Sjogren's Syndrome         40 - 70%                         Neonatal Lupus                 100% ---------------------   ------------------------   --------- SSB (La)                 SLE                                                       10%                         Sjogren's Syndrome              30% ---------------------   -----------------------    --------- Sm (anti-Smith)          SLE                        15 - 30% ---------------------   -----------------------    --------- RNP                      Mixed Connective Tissue                         Disease                         95% (U1 nRNP,                SLE  30 - 50% anti-ribonucleoprotein)  Polymyositis and/or                         Dermatomyositis                 20% ---------------------   ------------------------   --------- Scl-70 (antiDNA          Scleroderma (diffuse)      20 - 35% topoisomerase)           [place]                           13% ---------------------   ------------------------   --------- Jo-1                     Polymyositis and/or                         Dermatomyositis            20 - 40% ---------------------   ------------------------   --------- Centromere B             Scleroderma -                           [place]                         variant                          80% Performed At: [facility] [place], [place] [id number] Rush Farmer MD QQ:[id number]    aPTT 06/23/2022 28  24 - 36 seconds Final   Performed at [facility], [place] [address], [place], [place] [address]   Rhuematoid fact SerPl-aCnc 06/23/2022 <10.0  <14.0 IU/mL Final   Comment: (NOTE) Performed At: [facility] [place], [place] [id number] Rush Farmer MD [id number]    Prothrombin Time 06/23/2022 13.4  11.4 - 15.2 seconds Final   INR 06/23/2022 1.0  0.8 - 1.2 Final   Comment: (NOTE) INR goal varies based on device and disease states. Performed at [facility], [place] [address] [place], [place] [address]    IgG (Immunoglobin G), Serum 06/23/2022 778  603 - 1,613 mg/dL Final   IgA 06/23/2022 129  90 - 386 mg/dL Final   IgM (Immunoglobulin M), Srm 06/23/2022 87  20 - 172 mg/dL Final   Total Protein ELP 06/23/2022 6.5  6.0 - 8.5 g/dL Corrected   Albumin SerPl Elph-Mcnc 06/23/2022 4.1  2.9 - 4.4 g/dL Corrected   Alpha 1 06/23/2022 0.2  0.0 - 0.4 g/dL Corrected   Alpha2 Glob SerPl Elph-Mcnc 06/23/2022 0.6  0.4 - 1.0 g/dL Corrected   B-Globulin SerPl Elph-Mcnc 06/23/2022 0.8  0.7 - 1.3 g/dL Corrected   Gamma Glob SerPl Elph-Mcnc 06/23/2022 0.8  0.4 - 1.8 g/dL Corrected   M Protein SerPl Elph-Mcnc 06/23/2022 Not Observed  Not Observed g/dL Corrected   Globulin, Total 06/23/2022 2.4  2.2 - 3.9 g/dL Corrected   Albumin/Glob SerPl 06/23/2022 1.8 (H)  0.7 - 1.7 Corrected   IFE 1 06/23/2022 Comment   Corrected   Comment: (NOTE) The immunofixation pattern appears unremarkable. Evidence of monoclonal protein is not apparent.    Please Note 06/23/2022 Comment   Corrected  Comment: (NOTE) Protein electrophoresis scan will follow via computer, mail, or courier delivery. Performed At: [facility] [place], [place] [id number] Rush Farmer MD XF:[id number]     Kappa free light chain 06/23/2022 13.0  3.3 - 19.4 mg/L Final   Lambda free light chains 06/23/2022 9.2  5.7 - 26.3 mg/L Final   Kappa, lambda light chain ratio 06/23/2022 1.41  0.26 - 1.65 Final   Comment: (NOTE) Performed At: [facility] [place], [place] [id number] Rush Farmer MD FB:[id number]    HIV Screen 4th Generation wRfx 06/23/2022 Non Reactive  Non Reactive Final   Comment: (NOTE) HIV Negative HIV-1/HIV-2 antibodies and HIV-1 p24 antigen were NOT detected. There is no laboratory evidence of HIV infection. Performed At: [facility] [place], [place] [id number] Rush Farmer MD WE:[id number]    Hepatitis B Surface Ag 06/23/2022 NON REACTIVE  NON REACTIVE Final   HCV Ab 06/23/2022 NON REACTIVE  NON REACTIVE Final   Comment: (NOTE) Nonreactive HCV antibody screen is consistent with no HCV infections,  unless recent infection is suspected or other evidence exists to indicate HCV infection.     Hep A IgM 06/23/2022 NON REACTIVE  NON REACTIVE Final   Hep B C IgM 06/23/2022 NON REACTIVE  NON REACTIVE Final   Performed at [facility], [place] [address], [place], [place] [address]   Haptoglobin 06/23/2022 82  29 - 370 mg/dL Final   Comment: (NOTE) Performed At: [facility] [address] [place], [place] [id number] Rush Farmer MD KD:[id number]    DAT, complement 06/23/2022 NEG   Final   DAT, IgG 06/23/2022    Final                   Value:NEG Performed at [facility], [place] [address], [place], [place] [address]    D-Dimer, Quant 06/23/2022 0.31  0.00 - 0.50 ug/mL-FEU Final   Comment: (NOTE) At the manufacturer cut-off value of 0.5 g/mL FEU, this assay has a negative predictive value of 95-100%.This assay is intended for use in conjunction with a clinical pretest probability (PTP) assessment model to exclude pulmonary embolism (PE) and deep venous thrombosis (DVT) in outpatients  suspected of PE or DVT. Results should be correlated with clinical presentation. Performed at [facility], [place] [address], [place],  [address]    Copper 06/23/2022 82  69 - 132 ug/dL Final   Comment: (NOTE) This test was developed and its performance characteristics determined by [facility]. It has not been cleared or approved by the Food and Drug Administration.                                Detection Limit = 5 Performed At: [facility] [place], [place] [id number] Rush Farmer MD [id number]    Sodium 06/23/2022 140  135 - 145 mmol/L Final   Potassium 06/23/2022 3.6  3.5 - 5.1 mmol/L Final   Chloride 06/23/2022 103  98 - 111 mmol/L Final   CO2 06/23/2022 28  22 - 32 mmol/L Final   Glucose, Bld 06/23/2022 97  70 - 99 mg/dL Final   Glucose reference range applies only to samples taken after fasting for at least 8 hours.   BUN 06/23/2022 18  6 - 20 mg/dL Final   Creatinine 06/23/2022 0.94  0.61 - 1.24 mg/dL Final   Calcium 06/23/2022 9.1  8.9 - 10.3 mg/dL Final   Total Protein 06/23/2022 7.3  6.5 - 8.1 g/dL Final   Albumin 06/23/2022 4.8  3.5 - 5.0 g/dL Final   AST 06/23/2022 26  15 - 41 U/L Final   ALT 06/23/2022 36  0 - 44 U/L Final   Alkaline Phosphatase 06/23/2022 74  38 - 126 U/L Final   Total Bilirubin 06/23/2022 1.0  0.3 - 1.2 mg/dL Final   GFR, Estimated 06/23/2022 >60  >60 mL/min Final   Comment: (NOTE) Calculated using the CKD-EPI Creatinine Equation (2021)    Anion gap 06/23/2022 9  5 - 15 Final   Performed at [facility], [place] [address], [place], [place] [address]   WBC Count 06/23/2022 6.5  4.0 - 10.5 K/uL Final   RBC 06/23/2022 5.47  4.22 - 5.81 MIL/uL Final   Hemoglobin 06/23/2022 16.9  13.0 - 17.0 g/dL Final   HCT 06/23/2022 50.7  39.0 - 52.0 % Final   MCV 06/23/2022 92.7  80.0 - 100.0 fL Final   MCH 06/23/2022 30.9  26.0 - 34.0 pg Final   MCHC 06/23/2022 33.3  30.0 - 36.0 g/dL Final    RDW 06/23/2022 12.1  11.5 - 15.5 % Final   Platelet Count 06/23/2022 125 (L)  150 - 400 K/uL Final   nRBC 06/23/2022 0.0  0.0 - 0.2 % Final   Neutrophils Relative % 06/23/2022 70  % Final   Neutro Abs 06/23/2022 4.6  1.7 - 7.7 K/uL Final   Lymphocytes Relative 06/23/2022 20  % Final   Lymphs Abs 06/23/2022 1.3  0.7 - 4.0 K/uL Final   Monocytes Relative 06/23/2022 8  % Final   Monocytes Absolute 06/23/2022 0.5  0.1 - 1.0 K/uL Final   Eosinophils Relative 06/23/2022 1  % Final   Eosinophils Absolute 06/23/2022 0.1  0.0 - 0.5 K/uL Final   Basophils Relative 06/23/2022 1  % Final   Basophils Absolute 06/23/2022 0.0  0.0 - 0.1 K/uL Final   Immature Granulocytes 06/23/2022 0  % Final   Abs Immature Granulocytes 06/23/2022 0.02  0.00 - 0.07 K/uL Final   Performed at [facility], [place] [address], [place], [place] [address]    RADIOGRAPHIC STUDIES: I have personally reviewed the radiological images as listed and agree with the findings in the report  No results found.  ASSESSMENT/PLAN 51 year old male with mild thrombocytopenia  Thrombocytopenia:  Possible causes in this patient After review of laboratories obtained since last visit we are left with the following possibilities  Decreased Production   Bone marrow failure PNH Schwann-Diamond Syndrome   Bone marrow malignancies MDS MPD  PNH   Bone marrow suppression Drug induced:   Atorvostatin, Prasugrel   Congenital Bernard-Soulier Syndrome Fanconi anemia Platelet type or pseudo von-Willebrand disease Wiskott-Aldrich Syndrome 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   Infection Bacterial:  Leptospirosis, brucellosis, anaplasmosis    Viral    Mycobacterial: TB, MAC    Parasitic:  Babesiosis   Neoplastic marrow infiltration Solid tumor    Lymphoid   Nutritional Deficiencies Vitamin B12, Folate,   Increased Destruction   Autoimmune Syndrome ITP   Infection  Bacterial Fungal Mycobacterial Parasitic Viral  CMV, EBV, parvo B19  Sequestration   Hypersplenism    Liver Disease Cirrhosis Fibrosis Steatohepatitis Portal HTN   Pulmonary HTN   Pseudothrombocytopenia   Antibody induced Cold Agglutinin   Spurious EDTA induced clumping Inadequately specimen anticoagulation Giant platelets counted as WBC's    We will  Reviewed results of labs with patient and his wife will obtain B12, folate, parvo B19 DNA, EBV PCR CMV PCR PNH profile smear for morphology review, ACE level and abdominal ultrasound  Prasugrel:  Hematologic & oncologic: Leukopenia (3%), anemia (2%), major hemorrhage (2%), minor hemorrhage (2%), major hemorrhage (life-threatening: 1%)  <1%, postmarketing, and/or case reports: Abnormal hepatic function tests, anaphylaxis, angioedema, hematoma, hemoptysis, hemorrhage (requiring inotropes or transfusion), hypersensitivity reaction, intracranial hemorrhage (symptomatic), re-operation due to bleeding, thrombocytopenia, thrombotic thrombocytopenic purpura   Atorvostatin:  Hematologic & oncologic: Immune thrombocytopenia Irish Lack 2010)    Cancer Staging  No matching staging information was found for the patient.   No problem-specific Assessment & Plan notes found for this encounter.   Orders Placed This Encounter  Procedures   US Abdomen Complete    Standing Status:   Future    Standing Expiration Date:   07/08/2023    Order Specific Question:   Reason for Exam (SYMPTOM  OR DIAGNOSIS REQUIRED)    Answer:   thrombocytopenia    Order Specific Question:   Preferred imaging location?    Answer:   External   Vitamin B12    Standing Status:   Future    Number of Occurrences:   1    Standing Expiration Date:   07/09/2023   Folate    Standing Status:   Future    Number of Occurrences:   1    Standing Expiration Date:   07/09/2023   Pathologist smear review    Standing Status:   Future    Number of Occurrences:   1    Standing  Expiration Date:   07/09/2023   Angiotensin converting enzyme    Standing Status:   Future    Number of Occurrences:   1    Standing Expiration Date:   07/09/2023   Epstein barr vrs(ebv dna by pcr)   CMV dna by pcr, qualitative   Human parvovirus DNA detection by PCR    Standing Status:   Future    Number of Occurrences:   1    Standing Expiration Date:   07/09/2023   PNH Profile (-High Sensitivity)    Standing Status:   Future    Number of Occurrences:   1    Standing Expiration Date:   07/09/2023   CBC with Differential (Cancer Center Only)    Standing Status:   Future    Number of Occurrences:   1    Standing Expiration Date:   07/09/2023    All questions were answered. The patient knows to call the clinic with any problems, questions or concerns.  This note was electronically signed.    Barbee Cough, MD  07/08/2022 12:37 PM

## 2022-07-09 LAB — ANGIOTENSIN CONVERTING ENZYME: Angiotensin-Converting Enzyme: 43 U/L (ref 14–82)

## 2022-07-10 DIAGNOSIS — N3000 Acute cystitis without hematuria: Secondary | ICD-10-CM | POA: Diagnosis not present

## 2022-07-10 DIAGNOSIS — R109 Unspecified abdominal pain: Secondary | ICD-10-CM | POA: Diagnosis not present

## 2022-07-10 LAB — EPSTEIN BARR VRS(EBV DNA BY PCR): EBV DNA QN by PCR: NEGATIVE IU/mL

## 2022-07-11 LAB — CMV DNA BY PCR, QUALITATIVE: CMV DNA, Qual PCR: NEGATIVE

## 2022-07-12 LAB — HUMAN PARVOVIRUS DNA DETECTION BY PCR: Parvovirus B19, PCR: NEGATIVE

## 2022-07-14 DIAGNOSIS — D696 Thrombocytopenia, unspecified: Secondary | ICD-10-CM | POA: Diagnosis not present

## 2022-07-14 DIAGNOSIS — N390 Urinary tract infection, site not specified: Secondary | ICD-10-CM | POA: Diagnosis not present

## 2022-07-14 DIAGNOSIS — R161 Splenomegaly, not elsewhere classified: Secondary | ICD-10-CM | POA: Diagnosis not present

## 2022-07-14 LAB — PNH PROFILE (-HIGH SENSITIVITY)

## 2022-07-21 NOTE — Progress Notes (Signed)
[facility] Follow up  Visit:  Patient Care Team: Darrol Jump, Hershal Coria as PCP - General (Family Medicine)  CHIEF COMPLAINTS/PURPOSE OF CONSULTATION:  Oncology History   No history exists.    HISTORY OF PRESENTING ILLNESS: Jordan Lewis 51 y.o. male is here because of thrombocytopenia Medical history notable for coronary artery disease, hypertension, hyperlipidemia, GERD  November 06 2020: WBC 11.9 hemoglobin 16.4 MCV 98 platelet count 116  April 12 2021:  CT AP  HEPATOBILIARY: Normal hepatic contours. No intra- or extrahepatic biliary dilatation. There is cholelithiasis without acute inflammation.  PANCREAS: Normal pancreas. No ductal dilatation or peripancreatic fluid collection.  SPLEEN: Normal.  October 24 2021:  Cardiac ECHO.  LEFT VENTRICLE  The left ventricular size is normal. There is normal left ventricular wall thickness. Left ventricular systolic function is normal. LV  ejection fraction = 55-60%. Normal left ventricular diastolic function and left atrial pressure. The left ventricular wall motion is normal.   November 12 2021: WBC 5.1 hemoglobin 16.2 MCV 90 platelet count 117 PSA 0.7  June 11, 2022 WBC 4.7 hemoglobin 16.0 MCV 92 platelet count 117; 60 segs 30 lymphs 9 monos 1 EO.  Chemistries showed normal albumin, transaminases, creatinine.  B12 greater than 2000 folate 7.5.  Ferritin 73 Hemoglobin A1c 5.7  June 23 2022:  [facility] Lewis Hematology Consult  Patient notes that PLT has been low since 2017 and typically runs about 117 Does not report bleeding problems.  Formerly had increased bruising while on Effient for a year following MI in November 05 2020 which was treated with stent placement.   Remains on ASA. He has been on atorvastatin since the MI.  Was on Metoprolol later changed to carvedilol due to hypotension.  Has been on Pepcid for 5 yrs.  No bleeding in setting of cardiac cath and stent placement      Cologuard results pending.   Has mild  sleep apnea.    Social:  Married.  Works for [employer] in [employer].  No EtOH or tobacco  [facility] Mother alive 56 recently diagnosed with rectal cancer Father died 69's died CAD  Brother alive 41 well Sister alive 4 endometriosis.    WBC 6.5 hemoglobin 16.9 MCV 93 platelet count 125; 70 segs 20 lymphs 8 monos 1 EO 1 basophil CMP normal Copper 82 Haptoglobin 82 Coombs test negative INR 1.0 PTT 28 D-dimer 0.31 Fibrinogen 268 Antibeta 2 glycoprotein antibodies negative anticardiolipin antibody negative Lupus anticoagulant testing negative ANA comprehensive panel negative.  Rheumatoid factor negative  SPEP with IEP negative.  Serum free kappa 13 lambda 9.4 with a kappa lambda 1.41.  Hepatitis ABC serologies negative.  HIV negative  July 08 2022: Scheduled follow up regarding thrombocytopenia Reviewed results of labs with patient and his wife.  Overall he feels well.  He continues to have no issues with bleeding  WBC 5.4 hemoglobin 16.1 platelet count 126 (62 segs 26 lymphs 10 monos 1 EO 1 basophil  Folate 12.1 B12 1374 Flow for PNH negative ACE 43 PCR for CMV negative.  PCR for EBV negative.  PCR for parvo B19 negative  July 14 2022:  Abdominal U/S.  Liver: No focal lesions. Within normal limits in parenchymal echogenicity. Portal vein patent with normal direction of blood flow towards the liver. Marland Kitchen  Spleen 8.7 x 12.3 x 7.4 cm with a volume of 413  cc.  (prominent in size)     July 22 2022:  Scheduled follow up for thrombocytopenia.  Recovering from a  UTI.  Reviewed results of labs and U/S with patient and wife. No bleeding issues.   Reviewed results of labs and U/S with patient  Review of Systems  Constitutional:  Negative for chills, diaphoresis, fatigue, fever and unexpected weight change.  HENT:   Negative for mouth sores, nosebleeds, sore throat and trouble swallowing.   Eyes:  Negative for eye problems and icterus.  Respiratory:  Negative for chest tightness, cough,  hemoptysis and shortness of breath.   Cardiovascular:  Negative for chest pain, leg swelling and palpitations.  Gastrointestinal:  Negative for abdominal pain, blood in stool, constipation, diarrhea and nausea.  Genitourinary:  Negative for dysuria, hematuria and nocturia.   Musculoskeletal:  Negative for arthralgias, back pain and myalgias.  Neurological:  Negative for dizziness, extremity weakness, headaches and numbness.  Hematological:  Negative for adenopathy. Does not bruise/bleed easily.  Psychiatric/Behavioral:  Negative for depression, sleep disturbance and suicidal ideas.     MEDICAL HISTORY: Past Medical History:  Diagnosis Date   CAD (coronary artery disease)    GERD (gastroesophageal reflux disease)    Hyperlipidemia    Hypertension    MI (myocardial infarction) ([place])     SURGICAL HISTORY: Past Surgical History:  Procedure Laterality Date   CARDIAC SURGERY     HERNIA REPAIR  1994    SOCIAL HISTORY: Social History   Socioeconomic History   Marital status: Married    Spouse name: Not on file   Number of children: 0   Years of education: Not on file   Highest education level: Master's degree (e.g., MA, MS, MEng, MEd, MSW, MBA)  Occupational History   Occupation: Librarian, academic  Tobacco Use   Smoking status: Never   Smokeless tobacco: Former    Quit date: 1994  Scientific laboratory technician Use: Never used  Substance and Sexual Activity   Alcohol use: Yes    Comment: rarely   Drug use: Never   Sexual activity: Not on file  Other Topics Concern   Not on file  Social History Narrative   Not on file   Social Determinants of Health   Financial Resource Strain: Not on file  Food Insecurity: Not on file  Transportation Needs: Not on file  Physical Activity: Not on file  Stress: Not on file  Social Connections: Not on file  Intimate Partner Violence: Not on file    FAMILY HISTORY Family History  Problem Relation Age of Onset   Rectal cancer Mother     Heart disease Father     ALLERGIES:  has No Known Allergies.  MEDICATIONS:  Current Outpatient Medications  Medication Sig Dispense Refill   aspirin EC 81 MG tablet Take 81 mg by mouth daily. Swallow whole.     atorvastatin (LIPITOR) 80 MG tablet Take by mouth.     carvedilol (COREG) 3.125 MG tablet Take 3.125 mg by mouth 2 (two) times daily.     Cholecalciferol (VITAMIN D-1000 MAX ST) 25 MCG (1000 UT) tablet Take by mouth.     cyanocobalamin (VITAMIN B12) 1000 MCG tablet Take 1 tablet by mouth daily.     famotidine (PEPCID) 40 MG tablet TAKE 1 TABLET BY MOUTH 30 MINUTES BEFORE THE MORNING AND EVENING MEAL 60 tablet 2   Multiple Vitamins-Minerals (ZINC PO) Take 24 drops by mouth daily.     Omega-3 Fatty Acids (FISH OIL) 1000 MG CAPS Take by mouth.     Turmeric (QC TUMERIC COMPLEX PO) Take by mouth.     No current  facility-administered medications for this visit.    PHYSICAL EXAMINATION:  ECOG PERFORMANCE STATUS: 1 - Symptomatic but completely ambulatory   There were no vitals filed for this visit.   There were no vitals filed for this visit.    Physical Exam Vitals and nursing note reviewed.  Constitutional:      Appearance: Normal appearance. He is not diaphoretic.     Comments: Here alone  HENT:     Head: Normocephalic and atraumatic.     Right Ear: External ear normal.     Left Ear: External ear normal.     Nose: Nose normal.  Eyes:     Conjunctiva/sclera: Conjunctivae normal.     Pupils: Pupils are equal, round, and reactive to light.  Cardiovascular:     Rate and Rhythm: Normal rate and regular rhythm.     Heart sounds:     No friction rub. No gallop.  Musculoskeletal:     Cervical back: Normal range of motion and neck supple. No rigidity or tenderness.  Lymphadenopathy:     Head:     Right side of head: No submental, submandibular, tonsillar, preauricular, posterior auricular or occipital adenopathy.     Left side of head: No submental, submandibular,  tonsillar, preauricular, posterior auricular or occipital adenopathy.     Cervical: No cervical adenopathy.     Right cervical: No superficial, deep or posterior cervical adenopathy.    Left cervical: No superficial, deep or posterior cervical adenopathy.     Upper Body:     Right upper body: No supraclavicular or axillary adenopathy.     Left upper body: No supraclavicular or axillary adenopathy.     Lower Body: No right inguinal adenopathy. No left inguinal adenopathy.  Skin:    Coloration: Skin is not jaundiced.  Neurological:     General: No focal deficit present.     Mental Status: He is alert and oriented to person, place, and time. Mental status is at baseline.  Psychiatric:        Mood and Affect: Mood normal.        Behavior: Behavior normal.        Thought Content: Thought content normal.        Judgment: Judgment normal.     LABORATORY DATA: I have personally reviewed the data as listed:  Office Visit on 07/08/2022  Component Date Value Ref Range Status   EBV DNA QN by PCR 07/08/2022 Negative  Negative IU/mL Final   Comment: (NOTE) No EBV DNA detected. The linear range of this assay is 35 - 100,000,000 IU/mL Performed At: [facility] [address] [place], [place] [id number] Rush Farmer MD VE:[id number]    CMV DNA, Qual PCR 07/08/2022 Negative  Negative Final   Comment: (NOTE) No Cytomegalovirus DNA Detected. This test was developed and its performance characteristics determined by [facility].  It has not been cleared or approved by the Food and Drug Administration.  The FDA has determined that such clearance or approval is not necessary. Performed At: [facility] [place], [place] [id number] Rush Farmer MD UM:[id number]    WBC Count 07/08/2022 5.4  4.0 - 10.5 K/uL Final   RBC 07/08/2022 5.25  4.22 - 5.81 MIL/uL Final   Hemoglobin 07/08/2022 16.1  13.0 - 17.0 g/dL Final   HCT 07/08/2022 48.1  39.0 - 52.0 % Final   MCV  07/08/2022 91.6  80.0 - 100.0 fL Final   MCH 07/08/2022 30.7  26.0 - 34.0 pg Final  MCHC 07/08/2022 33.5  30.0 - 36.0 g/dL Final   RDW 07/08/2022 12.3  11.5 - 15.5 % Final   Platelet Count 07/08/2022 126 (L)  150 - 400 K/uL Final   nRBC 07/08/2022 0.0  0.0 - 0.2 % Final   Neutrophils Relative % 07/08/2022 62  % Final   Neutro Abs 07/08/2022 3.4  1.7 - 7.7 K/uL Final   Lymphocytes Relative 07/08/2022 26  % Final   Lymphs Abs 07/08/2022 1.4  0.7 - 4.0 K/uL Final   Monocytes Relative 07/08/2022 10  % Final   Monocytes Absolute 07/08/2022 0.5  0.1 - 1.0 K/uL Final   Eosinophils Relative 07/08/2022 1  % Final   Eosinophils Absolute 07/08/2022 0.1  0.0 - 0.5 K/uL Final   Basophils Relative 07/08/2022 1  % Final   Basophils Absolute 07/08/2022 0.0  0.0 - 0.1 K/uL Final   Immature Granulocytes 07/08/2022 0  % Final   Abs Immature Granulocytes 07/08/2022 0.01  0.00 - 0.07 K/uL Final   Performed at [facility], [place] [address], [place], [place] [address]   Interpretation 07/08/2022 Comment   Final   Comment: (NOTE) Peripheral Blood: No evidence of paroxysmal nocturnal hemoglobinuria (PNH) DISCLAIMER: REFER TO HARDCOPY OR PDF FOR COMPLETE RESULT. If synopsis provided, clinical decisions should not be based on this interfaced synopsis alone. Performed At: ;# [facility] [address] [place], [place] [id number] Ebony Hail MD DU:[id number]    Parvovirus B19, PCR 07/08/2022 Negative  Negative Final   Comment: (NOTE) No Parvovirus B19 DNA detected. This test was developed and its performance characteristics determined by [employer]. It has not been cleared or approved by the U.S. Food and Drug Administration. The FDA has determined that such clearance or approval is not necessary. This test is used for clinical purposes. It should not be regarded as investigational or research. Performed At: [facility] [address]  [place], [place] [id number] Rush Farmer MD VO:[id number]    Angiotensin-Converting Enzyme 07/08/2022 43  14 - 82 U/L Final   Comment: (NOTE) Performed At: [facility] [place], [place] [id number] Rush Farmer MD OJ:[id number]    Folate 07/08/2022 12.1  >5.9 ng/mL Final   Performed at [facility], [place] [address], [place], [place] [address]   Vitamin B-12 07/08/2022 1,374 (H)  180 - 914 pg/mL Final   Comment: (NOTE) This assay is not validated for testing neonatal or myeloproliferative syndrome specimens for Vitamin B12 levels. Performed at [facility], [place] [address], [place], [place] [address]   Appointment on 06/23/2022  Component Date Value Ref Range Status   Fibrinogen 06/23/2022 268  210 - 475 mg/dL Final   Comment: (NOTE) Fibrinogen results may be underestimated in patients receiving thrombolytic therapy. Performed at [facility], [place] [address] [place], [place] [address]    PTT Lupus Anticoagulant 06/23/2022 35.0  0.0 - 43.5 sec Final   DRVVT 06/23/2022 34.0  0.0 - 47.0 sec Final   Lupus Anticoag Interp 06/23/2022 Comment:   Corrected   Comment: (NOTE) No lupus anticoagulant was detected. Performed At: [facility] [place], [place] [id number] Rush Farmer MD DP:[id number]    Anticardiolipin IgG 06/23/2022 <9  0 - 14 GPL U/mL Final   Comment: (NOTE)                          Negative:              <  15                          Indeterminate:     15 - 20                          Low-Med Positive: >20 - 80                          High Positive:         >80    Anticardiolipin IgM 06/23/2022 <9  0 - 12 MPL U/mL Final   Comment: (NOTE)                          Negative:              <13                          Indeterminate:     13 - 20                          Low-Med Positive: >20 - 80                          High Positive:         >80 Performed At: [facility] [place], [place] [id number] Rush Farmer MD WU:[id number]    Beta-2 Glyco I IgG 06/23/2022 <9  0 - 20 GPI IgG units Final   Comment: (NOTE) The reference interval reflects a 3SD or 99th percentile interval, which is thought to represent a potentially clinically significant result in accordance with the International Consensus Statement on the classification criteria for definitive antiphospholipid syndrome (APS). J Thromb Haem 2006;4:295-306.    Beta-2-Glycoprotein I IgM 06/23/2022 <9  0 - 32 GPI IgM units Final   Comment: (NOTE) The reference interval reflects a 3SD or 99th percentile interval, which is thought to represent a potentially clinically significant result in accordance with the International Consensus Statement on the classification criteria for definitive antiphospholipid syndrome (APS). J Thromb Haem 2006;4:295-306. Performed At: [facility] [place], [place] [id number] Rush Farmer MD VH:[id number]    Beta-2-Glycoprotein I IgA 06/23/2022 <9  0 - 25 GPI IgA units Final   Comment: (NOTE) The reference interval reflects a 3SD or 99th percentile interval, which is thought to represent a potentially clinically significant result in accordance with the International Consensus Statement on the classification criteria for definitive antiphospholipid syndrome (APS). J Thromb Haem 2006;4:295-306.    ds DNA Ab 06/23/2022 3  0 - 9 IU/mL Final   Comment: (NOTE)                                   Negative      <5                                   Equivocal  5 - 9  Positive      >9    Ribonucleic Protein 06/23/2022 0.3  0.0 - 0.9 AI Final   ENA SM Ab Ser-aCnc 06/23/2022 <0.2  0.0 - 0.9 AI Final   Scleroderma (Scl-70) (ENA) Antibod* 06/23/2022 <0.2  0.0 - 0.9 AI Final   SSA (Ro) (ENA) Antibody, IgG 06/23/2022 <0.2  0.0 - 0.9 AI Final   SSB (La) (ENA) Antibody, IgG 06/23/2022 <0.2  0.0  - 0.9 AI Final   Chromatin Ab SerPl-aCnc 06/23/2022 <0.2  0.0 - 0.9 AI Final   Anti JO-1 06/23/2022 <0.2  0.0 - 0.9 AI Final   Centromere Ab Screen 06/23/2022 <0.2  0.0 - 0.9 AI Final   See below: 06/23/2022 Comment   Final   Comment: (NOTE) Autoantibody                       Disease Association ------------------------------------------------------------                        Condition                  Frequency ---------------------   ------------------------   --------- Antinuclear Antibody,    SLE, mixed connective Direct (ANA-D)           tissue diseases ---------------------   ------------------------   --------- dsDNA                    SLE                        40 - 60% ---------------------   ------------------------   --------- Chromatin                Drug induced SLE                90%                         SLE                        48 - 97% ---------------------   ------------------------   --------- SSA (Ro)                 SLE                        25 - 35%                         Sjogren's Syndrome         40 - 70%                         Neonatal Lupus                 100% ---------------------   ------------------------   --------- SSB (La)                 SLE                                                       10%  Sjogren's Syndrome              30% ---------------------   -----------------------    --------- Sm (anti-Smith)          SLE                        15 - 30% ---------------------   -----------------------    --------- RNP                      Mixed Connective Tissue                         Disease                         95% (U1 nRNP,                SLE                        30 - 50% anti-ribonucleoprotein)  Polymyositis and/or                         Dermatomyositis                 20% ---------------------   ------------------------   --------- Scl-70 (antiDNA          Scleroderma (diffuse)      20 -  35% topoisomerase)           Crest                           13% ---------------------   ------------------------   --------- Jo-1                     Polymyositis and/or                         Dermatomyositis            20 - 40% ---------------------   ------------------------   --------- Centromere B             Scleroderma -                           Crest                         variant                         80% Performed At: [facility] [employer] [place], [place] [id number] Rush Farmer MD CV:[id number]    aPTT 06/23/2022 28  24 - 36 seconds Final   Performed at [facility], [place] [address] [place], [place] [address]   Rhuematoid fact SerPl-aCnc 06/23/2022 <10.0  <14.0 IU/mL Final   Comment: (NOTE) Performed At: [facility] [place], [place] [id number] Rush Farmer MD TI:[id number]    Prothrombin Time 06/23/2022 13.4  11.4 - 15.2 seconds Final   INR 06/23/2022 1.0  0.8 - 1.2 Final   Comment: (NOTE) INR goal varies based on device and disease states. Performed at [facility], [place] [address], [place], [place] [address]    IgG (Immunoglobin G), Serum  06/23/2022 778  603 - 1,613 mg/dL Final   IgA 06/23/2022 129  90 - 386 mg/dL Final   IgM (Immunoglobulin M), Srm 06/23/2022 87  20 - 172 mg/dL Final   Total Protein ELP 06/23/2022 6.5  6.0 - 8.5 g/dL Corrected   Albumin SerPl Elph-Mcnc 06/23/2022 4.1  2.9 - 4.4 g/dL Corrected   Alpha 1 06/23/2022 0.2  0.0 - 0.4 g/dL Corrected   Alpha2 Glob SerPl Elph-Mcnc 06/23/2022 0.6  0.4 - 1.0 g/dL Corrected   B-Globulin SerPl Elph-Mcnc 06/23/2022 0.8  0.7 - 1.3 g/dL Corrected   Gamma Glob SerPl Elph-Mcnc 06/23/2022 0.8  0.4 - 1.8 g/dL Corrected   M Protein SerPl Elph-Mcnc 06/23/2022 Not Observed  Not Observed g/dL Corrected   Globulin, Total 06/23/2022 2.4  2.2 - 3.9 g/dL Corrected   Albumin/Glob SerPl 06/23/2022 1.8 (H)  0.7 - 1.7 Corrected   IFE 1  06/23/2022 Comment   Corrected   Comment: (NOTE) The immunofixation pattern appears unremarkable. Evidence of monoclonal protein is not apparent.    Please Note 06/23/2022 Comment   Corrected   Comment: (NOTE) Protein electrophoresis scan will follow via computer, mail, or courier delivery. Performed At: [facility] [place], [place] [id number] Rush Farmer MD KD:[id number]    Kappa free light chain 06/23/2022 13.0  3.3 - 19.4 mg/L Final   Lambda free light chains 06/23/2022 9.2  5.7 - 26.3 mg/L Final   Kappa, lambda light chain ratio 06/23/2022 1.41  0.26 - 1.65 Final   Comment: (NOTE) Performed At: [facility] [place], [place] [id number] Rush Farmer MD XT:[id number]    HIV Screen 4th Generation wRfx 06/23/2022 Non Reactive  Non Reactive Final   Comment: (NOTE) HIV Negative HIV-1/HIV-2 antibodies and HIV-1 p24 antigen were NOT detected. There is no laboratory evidence of HIV infection. Performed At: [facility] [place], [place] [id number] Rush Farmer MD HD:[id number]    Hepatitis B Surface Ag 06/23/2022 NON REACTIVE  NON REACTIVE Final   HCV Ab 06/23/2022 NON REACTIVE  NON REACTIVE Final   Comment: (NOTE) Nonreactive HCV antibody screen is consistent with no HCV infections,  unless recent infection is suspected or other evidence exists to indicate HCV infection.     Hep A IgM 06/23/2022 NON REACTIVE  NON REACTIVE Final   Hep B C IgM 06/23/2022 NON REACTIVE  NON REACTIVE Final   Performed at [facility], [place] [address], [place], [place] [address]   Haptoglobin 06/23/2022 82  29 - 370 mg/dL Final   Comment: (NOTE) Performed At: [facility] [address] [place], [place] [id number] Rush Farmer MD EH:[id number]    DAT, complement 06/23/2022 NEG   Final   DAT, IgG 06/23/2022    Final                   Value:NEG Performed at [facility], [place] [address] [place], [place] [address]    D-Dimer, Quant 06/23/2022 0.31  0.00 - 0.50 ug/mL-FEU Final   Comment: (NOTE) At the manufacturer cut-off value of 0.5 g/mL FEU, this assay has a negative predictive value of 95-100%.This assay is intended for use in conjunction with a clinical pretest probability (PTP) assessment model to exclude pulmonary embolism (PE) and deep venous thrombosis (DVT) in outpatients suspected of PE or DVT. Results should be correlated with clinical presentation. Performed at [facility], [place] [address] [place], [place] [address]    Copper 06/23/2022  82  69 - 132 ug/dL Final   Comment: (NOTE) This test was developed and its performance characteristics determined by [facility]. It has not been cleared or approved by the Food and Drug Administration.                                Detection Limit = 5 Performed At: [facility] [place], [place] [id number] Rush Farmer MD XI:[id number]    Sodium 06/23/2022 140  135 - 145 mmol/L Final   Potassium 06/23/2022 3.6  3.5 - 5.1 mmol/L Final   Chloride 06/23/2022 103  98 - 111 mmol/L Final   CO2 06/23/2022 28  22 - 32 mmol/L Final   Glucose, Bld 06/23/2022 97  70 - 99 mg/dL Final   Glucose reference range applies only to samples taken after fasting for at least 8 hours.   BUN 06/23/2022 18  6 - 20 mg/dL Final   Creatinine 06/23/2022 0.94  0.61 - 1.24 mg/dL Final   Calcium 06/23/2022 9.1  8.9 - 10.3 mg/dL Final   Total Protein 06/23/2022 7.3  6.5 - 8.1 g/dL Final   Albumin 06/23/2022 4.8  3.5 - 5.0 g/dL Final   AST 06/23/2022 26  15 - 41 U/L Final   ALT 06/23/2022 36  0 - 44 U/L Final   Alkaline Phosphatase 06/23/2022 74  38 - 126 U/L Final   Total Bilirubin 06/23/2022 1.0  0.3 - 1.2 mg/dL Final   GFR, Estimated 06/23/2022 >60  >60 mL/min Final   Comment: (NOTE) Calculated using the CKD-EPI Creatinine Equation (2021)    Anion gap 06/23/2022 9  5 - 15 Final   Performed at [facility], [place] [address] [place], [place] [address]   WBC Count 06/23/2022 6.5  4.0 - 10.5 K/uL Final   RBC 06/23/2022 5.47  4.22 - 5.81 MIL/uL Final   Hemoglobin 06/23/2022 16.9  13.0 - 17.0 g/dL Final   HCT 06/23/2022 50.7  39.0 - 52.0 % Final   MCV 06/23/2022 92.7  80.0 - 100.0 fL Final   MCH 06/23/2022 30.9  26.0 - 34.0 pg Final   MCHC 06/23/2022 33.3  30.0 - 36.0 g/dL Final   RDW 06/23/2022 12.1  11.5 - 15.5 % Final   Platelet Count 06/23/2022 125 (L)  150 - 400 K/uL Final   nRBC 06/23/2022 0.0  0.0 - 0.2 % Final   Neutrophils Relative % 06/23/2022 70  % Final   Neutro Abs 06/23/2022 4.6  1.7 - 7.7 K/uL Final   Lymphocytes Relative 06/23/2022 20  % Final   Lymphs Abs 06/23/2022 1.3  0.7 - 4.0 K/uL Final   Monocytes Relative 06/23/2022 8  % Final   Monocytes Absolute 06/23/2022 0.5  0.1 - 1.0 K/uL Final   Eosinophils Relative 06/23/2022 1  % Final   Eosinophils Absolute 06/23/2022 0.1  0.0 - 0.5 K/uL Final   Basophils Relative 06/23/2022 1  % Final   Basophils Absolute 06/23/2022 0.0  0.0 - 0.1 K/uL Final   Immature Granulocytes 06/23/2022 0  % Final   Abs Immature Granulocytes 06/23/2022 0.02  0.00 - 0.07 K/uL Final   Performed at [facility], [place] [address], [place], [place] [address]    RADIOGRAPHIC STUDIES: I have personally reviewed the radiological images as listed and agree with the findings in the report  No results found.  ASSESSMENT/PLAN 51 year old male with mild thrombocytopenia  Thrombocytopenia:  Possible causes in this patient After review of laboratories obtained since last visit we are left with the following possibilities  Decreased Production   Bone marrow failure Schwann-Diamond Syndrome   Bone marrow malignancies MDS MPD    Bone marrow suppression Drug induced:   Atorvostatin, Prasugrel   Congenital Bernard-Soulier Syndrome Fanconi anemia Platelet type or pseudo von-Willebrand disease Wiskott-Aldrich  Syndrome 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   Infection Bacterial:  Leptospirosis, brucellosis, anaplasmosis    Mycobacterial: TB, MAC    Parasitic:  Babesiosis   Neoplastic marrow infiltration Solid tumor    Lymphoid  Increased Destruction   Autoimmune Syndrome ITP  Sequestration   Hypersplenism    Pulmonary HTN   Pseudothrombocytopenia   Antibody induced Cold Agglutinin   Spurious EDTA induced clumping Inadequately specimen anticoagulation Giant platelets counted as WBC's   Prasugrel:  Hematologic & oncologic: Leukopenia (3%), anemia (2%), major hemorrhage (2%), minor hemorrhage (2%), major hemorrhage (life-threatening: 1%)  <1%, postmarketing, and/or case reports: Abnormal hepatic function tests, anaphylaxis, angioedema, hematoma, hemoptysis, hemorrhage (requiring inotropes or transfusion), hypersensitivity reaction, intracranial hemorrhage (symptomatic), re-operation due to bleeding, thrombocytopenia, thrombotic thrombocytopenic purpura   Atorvostatin:  Hematologic & oncologic: Immune thrombocytopenia (Narayanan 2010)   Given that the spleen is slightly enlarged, hypersplenism is a consideration.    Next step in evaluation would be bone marrow bx and aspirate which patient would like to avoid at this time.  Will draw Quantiferon gold, Jak 2 panel and bcr-abl.  In future could consider Tempus xT hematology panel    Cancer Staging  No matching staging information was found for the patient.   No problem-specific Assessment & Plan notes found for this encounter.   No orders of the defined types were placed in this encounter.   All questions were answered. The patient knows to call the clinic with any problems, questions or concerns.  This note was electronically signed.    Barbee Cough, MD  07/21/2022 3:58 PM

## 2022-07-22 ENCOUNTER — Telehealth: Payer: Self-pay | Admitting: Oncology

## 2022-07-22 ENCOUNTER — Inpatient Hospital Stay: Payer: BC Managed Care – PPO | Attending: Oncology | Admitting: Oncology

## 2022-07-22 ENCOUNTER — Encounter: Payer: Self-pay | Admitting: Oncology

## 2022-07-22 VITALS — BP 113/74 | HR 66 | Temp 98.1°F | Resp 14 | Ht 66.2 in | Wt 163.1 lb

## 2022-07-22 DIAGNOSIS — D696 Thrombocytopenia, unspecified: Secondary | ICD-10-CM

## 2022-07-22 DIAGNOSIS — Z7189 Other specified counseling: Secondary | ICD-10-CM

## 2022-07-22 DIAGNOSIS — R161 Splenomegaly, not elsewhere classified: Secondary | ICD-10-CM | POA: Diagnosis not present

## 2022-07-22 NOTE — Telephone Encounter (Signed)
Patient has been scheduled for follow-up visit per 07/22/22 los.

## 2022-07-24 DIAGNOSIS — Z7189 Other specified counseling: Secondary | ICD-10-CM | POA: Insufficient documentation

## 2022-07-24 DIAGNOSIS — R161 Splenomegaly, not elsewhere classified: Secondary | ICD-10-CM | POA: Insufficient documentation

## 2022-08-13 DIAGNOSIS — R300 Dysuria: Secondary | ICD-10-CM | POA: Diagnosis not present

## 2022-08-26 DIAGNOSIS — Z6826 Body mass index (BMI) 26.0-26.9, adult: Secondary | ICD-10-CM | POA: Diagnosis not present

## 2022-08-26 DIAGNOSIS — N390 Urinary tract infection, site not specified: Secondary | ICD-10-CM | POA: Diagnosis not present

## 2022-08-26 DIAGNOSIS — N3001 Acute cystitis with hematuria: Secondary | ICD-10-CM | POA: Diagnosis not present

## 2022-08-26 DIAGNOSIS — R300 Dysuria: Secondary | ICD-10-CM | POA: Diagnosis not present

## 2022-09-22 ENCOUNTER — Telehealth: Payer: Self-pay | Admitting: Oncology

## 2022-09-22 ENCOUNTER — Inpatient Hospital Stay: Payer: BC Managed Care – PPO | Attending: Oncology | Admitting: Oncology

## 2022-09-22 ENCOUNTER — Inpatient Hospital Stay: Payer: BC Managed Care – PPO

## 2022-09-22 VITALS — BP 122/85 | HR 58 | Temp 98.2°F | Resp 14 | Ht 66.2 in | Wt 166.0 lb

## 2022-09-22 DIAGNOSIS — N390 Urinary tract infection, site not specified: Secondary | ICD-10-CM

## 2022-09-22 DIAGNOSIS — Z7902 Long term (current) use of antithrombotics/antiplatelets: Secondary | ICD-10-CM | POA: Insufficient documentation

## 2022-09-22 DIAGNOSIS — D696 Thrombocytopenia, unspecified: Secondary | ICD-10-CM

## 2022-09-22 DIAGNOSIS — K219 Gastro-esophageal reflux disease without esophagitis: Secondary | ICD-10-CM | POA: Insufficient documentation

## 2022-09-22 DIAGNOSIS — I252 Old myocardial infarction: Secondary | ICD-10-CM | POA: Insufficient documentation

## 2022-09-22 DIAGNOSIS — Z800 Family history of malignant neoplasm of digestive organs: Secondary | ICD-10-CM | POA: Insufficient documentation

## 2022-09-22 DIAGNOSIS — Z87891 Personal history of nicotine dependence: Secondary | ICD-10-CM | POA: Insufficient documentation

## 2022-09-22 DIAGNOSIS — E785 Hyperlipidemia, unspecified: Secondary | ICD-10-CM | POA: Diagnosis not present

## 2022-09-22 DIAGNOSIS — Z87440 Personal history of urinary (tract) infections: Secondary | ICD-10-CM | POA: Insufficient documentation

## 2022-09-22 DIAGNOSIS — Z79899 Other long term (current) drug therapy: Secondary | ICD-10-CM | POA: Insufficient documentation

## 2022-09-22 DIAGNOSIS — Z8249 Family history of ischemic heart disease and other diseases of the circulatory system: Secondary | ICD-10-CM | POA: Diagnosis not present

## 2022-09-22 DIAGNOSIS — I10 Essential (primary) hypertension: Secondary | ICD-10-CM | POA: Insufficient documentation

## 2022-09-22 DIAGNOSIS — G4730 Sleep apnea, unspecified: Secondary | ICD-10-CM | POA: Insufficient documentation

## 2022-09-22 DIAGNOSIS — Z7189 Other specified counseling: Secondary | ICD-10-CM

## 2022-09-22 DIAGNOSIS — I2510 Atherosclerotic heart disease of native coronary artery without angina pectoris: Secondary | ICD-10-CM | POA: Insufficient documentation

## 2022-09-22 LAB — CBC WITH DIFFERENTIAL/PLATELET
Abs Immature Granulocytes: 0.02 10*3/uL (ref 0.00–0.07)
Basophils Absolute: 0 10*3/uL (ref 0.0–0.1)
Basophils Relative: 0 %
Eosinophils Absolute: 0.1 10*3/uL (ref 0.0–0.5)
Eosinophils Relative: 1 %
HCT: 47.3 % (ref 39.0–52.0)
Hemoglobin: 15.7 g/dL (ref 13.0–17.0)
Immature Granulocytes: 0 %
Lymphocytes Relative: 17 %
Lymphs Abs: 1.3 10*3/uL (ref 0.7–4.0)
MCH: 30.1 pg (ref 26.0–34.0)
MCHC: 33.2 g/dL (ref 30.0–36.0)
MCV: 90.6 fL (ref 80.0–100.0)
Monocytes Absolute: 0.7 10*3/uL (ref 0.1–1.0)
Monocytes Relative: 8 %
Neutro Abs: 5.8 10*3/uL (ref 1.7–7.7)
Neutrophils Relative %: 74 %
Platelets: 106 10*3/uL — ABNORMAL LOW (ref 150–400)
RBC: 5.22 MIL/uL (ref 4.22–5.81)
RDW: 12.7 % (ref 11.5–15.5)
WBC: 7.9 10*3/uL (ref 4.0–10.5)
nRBC: 0 % (ref 0.0–0.2)

## 2022-09-22 NOTE — Progress Notes (Unsigned)
[facility] Follow up  Visit:  Patient Care Team: Darrol Jump, Hershal Coria as PCP - General (Family Medicine)  CHIEF COMPLAINTS/PURPOSE OF CONSULTATION:  Oncology History   No history exists.    HISTORY OF PRESENTING ILLNESS: Jordan Lewis 51 y.o. male is here because of thrombocytopenia Medical history notable for coronary artery disease, hypertension, hyperlipidemia, GERD  November 06 2020: WBC 11.9 hemoglobin 16.4 MCV 98 platelet count 116  April 12 2021:  CT AP  HEPATOBILIARY: Normal hepatic contours. No intra- or extrahepatic biliary dilatation. There is cholelithiasis without acute inflammation.  PANCREAS: Normal pancreas. No ductal dilatation or peripancreatic fluid collection.  SPLEEN: Normal.  October 24 2021:  Cardiac ECHO.  LEFT VENTRICLE  The left ventricular size is normal. There is normal left ventricular wall thickness. Left ventricular systolic function is normal. LV  ejection fraction = 55-60%. Normal left ventricular diastolic function and left atrial pressure. The left ventricular wall motion is normal.   November 12 2021: WBC 5.1 hemoglobin 16.2 MCV 90 platelet count 117 PSA 0.7  June 11, 2022 WBC 4.7 hemoglobin 16.0 MCV 92 platelet count 117; 60 segs 30 lymphs 9 monos 1 EO.  Chemistries showed normal albumin, transaminases, creatinine.  B12 greater than 2000 folate 7.5.  Ferritin 73 Hemoglobin A1c 5.7  June 23 2022:  [facility]  Patient notes that PLT has been low since 2017 and typically runs about 117 Does not report bleeding problems.  Formerly had increased bruising while on Effient for a year following MI in November 05 2020 which was treated with stent placement.   Remains on ASA. He has been on atorvastatin since the MI.  Was on Metoprolol later changed to carvedilol due to hypotension.  Has been on Pepcid for 5 yrs.  No bleeding in setting of cardiac cath and stent placement      Cologuard results pending.   Has mild  sleep apnea.    Social:  Married.  Works for [employer] in [employer].  No EtOH or tobacco  [facility] Mother alive 57 recently diagnosed with rectal cancer Father died 28's died CAD  Brother alive 42 well Sister alive 42 endometriosis.    WBC 6.5 hemoglobin 16.9 MCV 93 platelet count 125; 70 segs 20 lymphs 8 monos 1 EO 1 basophil CMP normal Copper 82 Haptoglobin 82 Coombs test negative INR 1.0 PTT 28 D-dimer 0.31 Fibrinogen 268 Antibeta 2 glycoprotein antibodies negative anticardiolipin antibody negative Lupus anticoagulant testing negative ANA comprehensive panel negative.  Rheumatoid factor negative  SPEP with IEP negative.  Serum free kappa 13 lambda 9.4 with a kappa lambda 1.41.  Hepatitis ABC serologies negative.  HIV negative  July 08 2022: Scheduled follow up regarding thrombocytopenia Reviewed results of labs with patient and his wife.  Overall he feels well.  He continues to have no issues with bleeding  WBC 5.4 hemoglobin 16.1 platelet count 126 (62 segs 26 lymphs 10 monos 1 EO 1 basophil  Folate 12.1 B12 1374 Flow for PNH negative ACE 43 PCR for CMV negative.  PCR for EBV negative.  PCR for parvo B19 negative  July 14 2022:  Abdominal U/S.  Liver: No focal lesions. Within normal limits in parenchymal echogenicity. Portal vein patent with normal direction of blood flow towards the liver. Marland Kitchen  Spleen 8.7 x 12.3 x 7.4 cm with a volume of 413  cc.  (prominent in size)     July 22 2022:  .  Recovering from a UTI.  Reviewed results  of labs and U/S with patient and wife. No bleeding issues.    September 22 2022:  Scheduled follow up for thrombocytopenia.  Reviewed results of labs with patient and his wife.  At this point recommended consideration for bone marrow bx.  Patient wishes to defer.  Continues to have problems with UTI; having difficulty clearing an E coli infection.  Recommended consideration for referral to Urology which patient declined at this time  Review of Systems   Constitutional:  Negative for chills, diaphoresis, fatigue, fever and unexpected weight change.  HENT:   Negative for mouth sores, nosebleeds, sore throat and trouble swallowing.   Eyes:  Negative for eye problems and icterus.  Respiratory:  Negative for chest tightness, cough, hemoptysis and shortness of breath.   Cardiovascular:  Negative for chest pain, leg swelling and palpitations.  Gastrointestinal:  Negative for abdominal pain, blood in stool, constipation, diarrhea and nausea.  Genitourinary:  Negative for dysuria, hematuria and nocturia.   Musculoskeletal:  Negative for arthralgias, back pain and myalgias.  Neurological:  Negative for dizziness, extremity weakness, headaches and numbness.  Hematological:  Negative for adenopathy. Does not bruise/bleed easily.  Psychiatric/Behavioral:  Negative for depression, sleep disturbance and suicidal ideas.     MEDICAL HISTORY: Past Medical History:  Diagnosis Date   CAD (coronary artery disease)    GERD (gastroesophageal reflux disease)    Hyperlipidemia    Hypertension    MI (myocardial infarction) ([place])     SURGICAL HISTORY: Past Surgical History:  Procedure Laterality Date   CARDIAC SURGERY     HERNIA REPAIR  1994    SOCIAL HISTORY: Social History   Socioeconomic History   Marital status: Married    Spouse name: Not on file   Number of children: 0   Years of education: Not on file   Highest education level: Master's degree (e.g., MA, MS, MEng, MEd, MSW, MBA)  Occupational History   Occupation: Librarian, academic  Tobacco Use   Smoking status: Never   Smokeless tobacco: Former    Quit date: 1994  Scientific laboratory technician Use: Never used  Substance and Sexual Activity   Alcohol use: Yes    Comment: rarely   Drug use: Never   Sexual activity: Not on file  Other Topics Concern   Not on file  Social History Narrative   Not on file   Social Determinants of Health   Financial Resource Strain: Not on file  Food  Insecurity: Not on file  Transportation Needs: Not on file  Physical Activity: Not on file  Stress: Not on file  Social Connections: Not on file  Intimate Partner Violence: Not on file    FAMILY HISTORY Family History  Problem Relation Age of Onset   Rectal cancer Mother    Heart disease Father     ALLERGIES:  has No Known Allergies.  MEDICATIONS:  Current Outpatient Medications  Medication Sig Dispense Refill   aspirin EC 81 MG tablet Take 81 mg by mouth daily. Swallow whole.     atorvastatin (LIPITOR) 80 MG tablet Take by mouth.     carvedilol (COREG) 3.125 MG tablet Take 3.125 mg by mouth 2 (two) times daily.     Cholecalciferol (VITAMIN D-1000 MAX ST) 25 MCG (1000 UT) tablet Take by mouth.     famotidine (PEPCID) 40 MG tablet TAKE 1 TABLET BY MOUTH 30 MINUTES BEFORE THE MORNING AND EVENING MEAL 60 tablet 2   Multiple Vitamins-Minerals (ZINC PO) Take 24 drops by mouth  daily.     nitrofurantoin, macrocrystal-monohydrate, (MACROBID) 100 MG capsule Take 100 mg by mouth 2 (two) times daily.     Omega-3 Fatty Acids (FISH OIL) 1000 MG CAPS Take by mouth.     Turmeric (QC TUMERIC COMPLEX PO) Take by mouth.     No current facility-administered medications for this visit.    PHYSICAL EXAMINATION:  ECOG PERFORMANCE STATUS: 1 - Symptomatic but completely ambulatory   There were no vitals filed for this visit.   There were no vitals filed for this visit.    Physical Exam Vitals and nursing note reviewed.  Constitutional:      Appearance: Normal appearance. He is not diaphoretic.     Comments: Here alone  HENT:     Head: Normocephalic and atraumatic.     Right Ear: External ear normal.     Left Ear: External ear normal.     Nose: Nose normal.  Eyes:     Conjunctiva/sclera: Conjunctivae normal.     Pupils: Pupils are equal, round, and reactive to light.  Cardiovascular:     Rate and Rhythm: Normal rate and regular rhythm.     Heart sounds:     No friction rub. No  gallop.  Musculoskeletal:     Cervical back: Normal range of motion and neck supple. No rigidity or tenderness.  Lymphadenopathy:     Head:     Right side of head: No submental, submandibular, tonsillar, preauricular, posterior auricular or occipital adenopathy.     Left side of head: No submental, submandibular, tonsillar, preauricular, posterior auricular or occipital adenopathy.     Cervical: No cervical adenopathy.     Right cervical: No superficial, deep or posterior cervical adenopathy.    Left cervical: No superficial, deep or posterior cervical adenopathy.     Upper Body:     Right upper body: No supraclavicular or axillary adenopathy.     Left upper body: No supraclavicular or axillary adenopathy.     Lower Body: No right inguinal adenopathy. No left inguinal adenopathy.  Skin:    Coloration: Skin is not jaundiced.  Neurological:     General: No focal deficit present.     Mental Status: He is alert and oriented to person, place, and time. Mental status is at baseline.  Psychiatric:        Mood and Affect: Mood normal.        Behavior: Behavior normal.        Thought Content: Thought content normal.        Judgment: Judgment normal.     LABORATORY DATA: I have personally reviewed the data as listed:  No visits with results within 1 Month(s) from this visit.  Latest known visit with results is:  Office Visit on 07/08/2022  Component Date Value Ref Range Status   EBV DNA QN by PCR 07/08/2022 Negative  Negative IU/mL Final   Comment: (NOTE) No EBV DNA detected. The linear range of this assay is 35 - 100,000,000 IU/mL Performed At: [facility] [address] [place], [place] [id number] Rush Farmer MD CV:[id number]    CMV DNA, Qual PCR 07/08/2022 Negative  Negative Final   Comment: (NOTE) No Cytomegalovirus DNA Detected. This test was developed and its performance characteristics determined by [facility].  It has not been cleared or approved by the Food and  Drug Administration.  The FDA has determined that such clearance or approval is not necessary. Performed At: [facility] [address] [place], [place] [id number] Perlie Gold  Sanjai MD EV:[id number]    WBC Count 07/08/2022 5.4  4.0 - 10.5 K/uL Final   RBC 07/08/2022 5.25  4.22 - 5.81 MIL/uL Final   Hemoglobin 07/08/2022 16.1  13.0 - 17.0 g/dL Final   HCT 07/08/2022 48.1  39.0 - 52.0 % Final   MCV 07/08/2022 91.6  80.0 - 100.0 fL Final   MCH 07/08/2022 30.7  26.0 - 34.0 pg Final   MCHC 07/08/2022 33.5  30.0 - 36.0 g/dL Final   RDW 07/08/2022 12.3  11.5 - 15.5 % Final   Platelet Count 07/08/2022 126 (L)  150 - 400 K/uL Final   nRBC 07/08/2022 0.0  0.0 - 0.2 % Final   Neutrophils Relative % 07/08/2022 62  % Final   Neutro Abs 07/08/2022 3.4  1.7 - 7.7 K/uL Final   Lymphocytes Relative 07/08/2022 26  % Final   Lymphs Abs 07/08/2022 1.4  0.7 - 4.0 K/uL Final   Monocytes Relative 07/08/2022 10  % Final   Monocytes Absolute 07/08/2022 0.5  0.1 - 1.0 K/uL Final   Eosinophils Relative 07/08/2022 1  % Final   Eosinophils Absolute 07/08/2022 0.1  0.0 - 0.5 K/uL Final   Basophils Relative 07/08/2022 1  % Final   Basophils Absolute 07/08/2022 0.0  0.0 - 0.1 K/uL Final   Immature Granulocytes 07/08/2022 0  % Final   Abs Immature Granulocytes 07/08/2022 0.01  0.00 - 0.07 K/uL Final   Performed at [facility], [place] [address] [place], [place] [address]   Interpretation 07/08/2022 Comment   Final   Comment: (NOTE) Peripheral Blood: No evidence of paroxysmal nocturnal hemoglobinuria ([facility]) DISCLAIMER: REFER TO HARDCOPY OR PDF FOR COMPLETE RESULT. If synopsis provided, clinical decisions should not be based on this interfaced synopsis alone. Performed At: ;# [facility] [address] [place], [place] [id number] Ebony Hail MD BP:[id number]    Parvovirus B19, PCR 07/08/2022 Negative  Negative Final   Comment: (NOTE) No Parvovirus B19 DNA  detected. This test was developed and its performance characteristics determined by [employer]. It has not been cleared or approved by the U.S. Food and Drug Administration. The FDA has determined that such clearance or approval is not necessary. This test is used for clinical purposes. It should not be regarded as investigational or research. Performed At: [facility] [address] [place], [place] [id number] Rush Farmer MD ER:[id number]    Angiotensin-Converting Enzyme 07/08/2022 43  14 - 82 U/L Final   Comment: (NOTE) Performed At: [facility] [place], [place] [id number] Rush Farmer MD IW:[id number]    Folate 07/08/2022 12.1  >5.9 ng/mL Final   Performed at [facility], [place] [address] [place], [place] [address]   Vitamin B-12 07/08/2022 1,374 (H)  180 - 914 pg/mL Final   Comment: (NOTE) This assay is not validated for testing neonatal or myeloproliferative syndrome specimens for Vitamin B12 levels. Performed at [facility], [place] [address], [place], [place] [address]     RADIOGRAPHIC STUDIES: I have personally reviewed the radiological images as listed and agree with the findings in the report  No results found.  ASSESSMENT/PLAN 51 year old male with mild thrombocytopenia  Thrombocytopenia:  Possible causes in this patient After review of laboratories obtained since last visit we are left with the following possibilities  Decreased Production   Bone marrow failure Schwann-Diamond Syndrome   Bone marrow malignancies MDS MPD    Bone marrow suppression Drug induced:  Atorvostatin, Prasugrel   Congenital Bernard-Soulier Syndrome Fanconi anemia Platelet type or pseudo von-Willebrand disease Wiskott-Aldrich Syndrome 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   Infection Bacterial:  Leptospirosis, brucellosis, anaplasmosis     Mycobacterial: TB, MAC    Parasitic:  Babesiosis   Neoplastic marrow infiltration Solid tumor    Lymphoid  Increased Destruction   Autoimmune Syndrome ITP  Sequestration   Hypersplenism    Pulmonary HTN   Pseudothrombocytopenia   Antibody induced Cold Agglutinin   Spurious EDTA induced clumping Inadequately specimen anticoagulation Giant platelets counted as WBC's   Prasugrel:  Hematologic & oncologic: Leukopenia (3%), anemia (2%), major hemorrhage (2%), minor hemorrhage (2%), major hemorrhage (life-threatening: 1%)  <1%, postmarketing, and/or case reports: Abnormal hepatic function tests, anaphylaxis, angioedema, hematoma, hemoptysis, hemorrhage (requiring inotropes or transfusion), hypersensitivity reaction, intracranial hemorrhage (symptomatic), re-operation due to bleeding, thrombocytopenia, thrombotic thrombocytopenic purpura   Atorvostatin:  Hematologic & oncologic: Immune thrombocytopenia (Narayanan 2010)   Given that the spleen is slightly enlarged, hypersplenism is a consideration.    Next step in evaluation would be bone marrow bx and aspirate which patient would like to avoid at this time.  Will draw Quantiferon gold, Jak 2 panel and bcr-abl.  In future could consider Tempus xT hematology panel    Cancer Staging  No matching staging information was found for the patient.   No problem-specific Assessment & Plan notes found for this encounter.   No orders of the defined types were placed in this encounter.   All questions were answered. The patient knows to call the clinic with any problems, questions or concerns.  This note was electronically signed.    Barbee Cough, MD  09/22/2022 8:50 AM

## 2022-09-22 NOTE — Telephone Encounter (Signed)
09/22/22 Next appt scheduled and confirmed with patient 

## 2022-09-23 LAB — COLD AGGLUTININ TITER: Cold Agglutinin Titer: NEGATIVE

## 2022-09-24 DIAGNOSIS — N390 Urinary tract infection, site not specified: Secondary | ICD-10-CM | POA: Insufficient documentation

## 2022-09-29 DIAGNOSIS — N390 Urinary tract infection, site not specified: Secondary | ICD-10-CM | POA: Diagnosis not present

## 2022-09-29 DIAGNOSIS — N400 Enlarged prostate without lower urinary tract symptoms: Secondary | ICD-10-CM | POA: Diagnosis not present

## 2022-10-15 DIAGNOSIS — N3001 Acute cystitis with hematuria: Secondary | ICD-10-CM | POA: Diagnosis not present

## 2022-10-15 DIAGNOSIS — R82998 Other abnormal findings in urine: Secondary | ICD-10-CM | POA: Diagnosis not present

## 2022-10-15 DIAGNOSIS — B9629 Other Escherichia coli [E. coli] as the cause of diseases classified elsewhere: Secondary | ICD-10-CM | POA: Diagnosis not present

## 2022-10-17 DIAGNOSIS — Z6827 Body mass index (BMI) 27.0-27.9, adult: Secondary | ICD-10-CM | POA: Diagnosis not present

## 2022-10-17 DIAGNOSIS — R300 Dysuria: Secondary | ICD-10-CM | POA: Diagnosis not present

## 2022-11-03 DIAGNOSIS — I2111 ST elevation (STEMI) myocardial infarction involving right coronary artery: Secondary | ICD-10-CM | POA: Diagnosis not present

## 2022-11-03 DIAGNOSIS — Z955 Presence of coronary angioplasty implant and graft: Secondary | ICD-10-CM | POA: Diagnosis not present

## 2022-11-03 DIAGNOSIS — I25118 Atherosclerotic heart disease of native coronary artery with other forms of angina pectoris: Secondary | ICD-10-CM | POA: Diagnosis not present

## 2022-11-03 DIAGNOSIS — E7800 Pure hypercholesterolemia, unspecified: Secondary | ICD-10-CM | POA: Diagnosis not present

## 2022-12-15 DIAGNOSIS — R509 Fever, unspecified: Secondary | ICD-10-CM | POA: Diagnosis not present

## 2022-12-15 DIAGNOSIS — R112 Nausea with vomiting, unspecified: Secondary | ICD-10-CM | POA: Diagnosis not present

## 2022-12-15 DIAGNOSIS — R1084 Generalized abdominal pain: Secondary | ICD-10-CM | POA: Diagnosis not present

## 2022-12-24 ENCOUNTER — Telehealth: Payer: Self-pay

## 2022-12-24 NOTE — Patient Outreach (Signed)
  Care Coordination   Initial Visit Note   12/24/2022 Name: Siddh Vandeventer MRN: [id number] DOB: 1971/01/13  Tsugio Elison is a 52 y.o. year old male who sees Darrol Jump, [place] for primary care. I spoke with  Anne Fu by phone today.  What matters to the patients health and wellness today?  Placed call to patient today to review and offer [facility] care coordination program.  Patient reports that he is doing well and denies any needs today.      SDOH assessments and interventions completed:  No     Care Coordination Interventions:  No, not indicated   Follow up plan: No further intervention required.   Encounter Outcome:  No Answer   Tomasa Rand, RN, BSN, CEN [place] Coordinator [phone]

## 2023-02-02 DIAGNOSIS — D696 Thrombocytopenia, unspecified: Secondary | ICD-10-CM | POA: Diagnosis not present

## 2023-03-01 ENCOUNTER — Other Ambulatory Visit: Payer: Self-pay | Admitting: Physician Assistant

## 2023-03-18 DIAGNOSIS — D696 Thrombocytopenia, unspecified: Secondary | ICD-10-CM | POA: Diagnosis not present

## 2023-03-18 DIAGNOSIS — I2510 Atherosclerotic heart disease of native coronary artery without angina pectoris: Secondary | ICD-10-CM | POA: Diagnosis not present

## 2023-03-18 DIAGNOSIS — E782 Mixed hyperlipidemia: Secondary | ICD-10-CM | POA: Diagnosis not present

## 2023-03-18 DIAGNOSIS — K219 Gastro-esophageal reflux disease without esophagitis: Secondary | ICD-10-CM | POA: Diagnosis not present

## 2023-03-18 DIAGNOSIS — I10 Essential (primary) hypertension: Secondary | ICD-10-CM | POA: Diagnosis not present

## 2023-03-24 ENCOUNTER — Other Ambulatory Visit: Payer: BC Managed Care – PPO

## 2023-03-24 ENCOUNTER — Ambulatory Visit: Payer: BC Managed Care – PPO | Admitting: Oncology

## 2023-04-08 DIAGNOSIS — G4730 Sleep apnea, unspecified: Secondary | ICD-10-CM | POA: Diagnosis not present

## 2023-05-11 DIAGNOSIS — I25118 Atherosclerotic heart disease of native coronary artery with other forms of angina pectoris: Secondary | ICD-10-CM | POA: Diagnosis not present

## 2023-05-11 DIAGNOSIS — I2111 ST elevation (STEMI) myocardial infarction involving right coronary artery: Secondary | ICD-10-CM | POA: Diagnosis not present

## 2023-05-11 DIAGNOSIS — E7800 Pure hypercholesterolemia, unspecified: Secondary | ICD-10-CM | POA: Diagnosis not present

## 2023-05-11 DIAGNOSIS — Z955 Presence of coronary angioplasty implant and graft: Secondary | ICD-10-CM | POA: Diagnosis not present

## 2023-07-01 DIAGNOSIS — U071 COVID-19: Secondary | ICD-10-CM | POA: Diagnosis not present

## 2023-07-01 DIAGNOSIS — R509 Fever, unspecified: Secondary | ICD-10-CM | POA: Diagnosis not present

## 2023-08-24 DIAGNOSIS — D696 Thrombocytopenia, unspecified: Secondary | ICD-10-CM | POA: Diagnosis not present

## 2023-09-06 DIAGNOSIS — J0100 Acute maxillary sinusitis, unspecified: Secondary | ICD-10-CM | POA: Diagnosis not present

## 2023-09-23 DIAGNOSIS — I2510 Atherosclerotic heart disease of native coronary artery without angina pectoris: Secondary | ICD-10-CM | POA: Diagnosis not present

## 2023-09-23 DIAGNOSIS — D696 Thrombocytopenia, unspecified: Secondary | ICD-10-CM | POA: Diagnosis not present

## 2023-09-23 DIAGNOSIS — I10 Essential (primary) hypertension: Secondary | ICD-10-CM | POA: Diagnosis not present

## 2023-09-23 DIAGNOSIS — E782 Mixed hyperlipidemia: Secondary | ICD-10-CM | POA: Diagnosis not present

## 2023-09-23 DIAGNOSIS — Z955 Presence of coronary angioplasty implant and graft: Secondary | ICD-10-CM | POA: Diagnosis not present

## 2024-03-21 DIAGNOSIS — D696 Thrombocytopenia, unspecified: Secondary | ICD-10-CM | POA: Diagnosis not present

## 2024-03-23 DIAGNOSIS — Z6826 Body mass index (BMI) 26.0-26.9, adult: Secondary | ICD-10-CM | POA: Diagnosis not present

## 2024-03-23 DIAGNOSIS — D696 Thrombocytopenia, unspecified: Secondary | ICD-10-CM | POA: Diagnosis not present

## 2024-03-23 DIAGNOSIS — E782 Mixed hyperlipidemia: Secondary | ICD-10-CM | POA: Diagnosis not present

## 2024-03-23 DIAGNOSIS — R7301 Impaired fasting glucose: Secondary | ICD-10-CM | POA: Diagnosis not present

## 2024-03-23 DIAGNOSIS — Z0000 Encounter for general adult medical examination without abnormal findings: Secondary | ICD-10-CM | POA: Diagnosis not present

## 2024-03-23 DIAGNOSIS — Z125 Encounter for screening for malignant neoplasm of prostate: Secondary | ICD-10-CM | POA: Diagnosis not present

## 2024-03-23 DIAGNOSIS — I10 Essential (primary) hypertension: Secondary | ICD-10-CM | POA: Diagnosis not present

## 2024-05-09 DIAGNOSIS — I25118 Atherosclerotic heart disease of native coronary artery with other forms of angina pectoris: Secondary | ICD-10-CM | POA: Diagnosis not present

## 2024-05-09 DIAGNOSIS — I2111 ST elevation (STEMI) myocardial infarction involving right coronary artery: Secondary | ICD-10-CM | POA: Diagnosis not present

## 2024-05-09 DIAGNOSIS — E7800 Pure hypercholesterolemia, unspecified: Secondary | ICD-10-CM | POA: Diagnosis not present

## 2024-05-09 DIAGNOSIS — Z955 Presence of coronary angioplasty implant and graft: Secondary | ICD-10-CM | POA: Diagnosis not present

## 2024-05-24 DIAGNOSIS — K529 Noninfective gastroenteritis and colitis, unspecified: Secondary | ICD-10-CM | POA: Diagnosis not present

## 2024-05-24 DIAGNOSIS — R110 Nausea: Secondary | ICD-10-CM | POA: Diagnosis not present

## 2024-05-24 DIAGNOSIS — R1084 Generalized abdominal pain: Secondary | ICD-10-CM | POA: Diagnosis not present

## 2024-09-23 DIAGNOSIS — R7301 Impaired fasting glucose: Secondary | ICD-10-CM | POA: Diagnosis not present

## 2024-09-23 DIAGNOSIS — I2510 Atherosclerotic heart disease of native coronary artery without angina pectoris: Secondary | ICD-10-CM | POA: Diagnosis not present

## 2024-09-23 DIAGNOSIS — E782 Mixed hyperlipidemia: Secondary | ICD-10-CM | POA: Diagnosis not present

## 2024-09-23 DIAGNOSIS — I10 Essential (primary) hypertension: Secondary | ICD-10-CM | POA: Diagnosis not present

## 2024-09-23 DIAGNOSIS — D696 Thrombocytopenia, unspecified: Secondary | ICD-10-CM | POA: Diagnosis not present

## 2024-09-23 DIAGNOSIS — Z955 Presence of coronary angioplasty implant and graft: Secondary | ICD-10-CM | POA: Diagnosis not present
